# Patient Record
Sex: Female | Born: 1995 | Race: White | Hispanic: No | Marital: Married | Smoking: Never smoker
Health system: Southern US, Community
[De-identification: ages and names within clinical notes are randomized; demographics above are authoritative.]

## PROBLEM LIST (undated history)

## (undated) ENCOUNTER — Inpatient Hospital Stay: Payer: Self-pay

## (undated) ENCOUNTER — Ambulatory Visit: Admission: EM | Payer: Self-pay | Source: Home / Self Care

## (undated) ENCOUNTER — Ambulatory Visit: Admission: EM | Payer: Managed Care, Other (non HMO) | Source: Home / Self Care

## (undated) DIAGNOSIS — F4310 Post-traumatic stress disorder, unspecified: Secondary | ICD-10-CM

## (undated) DIAGNOSIS — R519 Headache, unspecified: Secondary | ICD-10-CM

## (undated) DIAGNOSIS — K219 Gastro-esophageal reflux disease without esophagitis: Secondary | ICD-10-CM

## (undated) DIAGNOSIS — M419 Scoliosis, unspecified: Secondary | ICD-10-CM

## (undated) DIAGNOSIS — F410 Panic disorder [episodic paroxysmal anxiety] without agoraphobia: Secondary | ICD-10-CM

## (undated) DIAGNOSIS — R51 Headache: Secondary | ICD-10-CM

## (undated) DIAGNOSIS — F419 Anxiety disorder, unspecified: Secondary | ICD-10-CM

## (undated) DIAGNOSIS — G43909 Migraine, unspecified, not intractable, without status migrainosus: Secondary | ICD-10-CM

## (undated) DIAGNOSIS — T4145XA Adverse effect of unspecified anesthetic, initial encounter: Secondary | ICD-10-CM

## (undated) DIAGNOSIS — D649 Anemia, unspecified: Secondary | ICD-10-CM

## (undated) DIAGNOSIS — T8859XA Other complications of anesthesia, initial encounter: Secondary | ICD-10-CM

## (undated) HISTORY — DX: Anxiety disorder, unspecified: F41.9

## (undated) HISTORY — PX: HEMORROIDECTOMY: SUR656

---

## 2017-11-03 NOTE — L&D Delivery Note (Signed)
[facility]  Labor Events:   Preterm labor:   Rupture date: 07/17/2018  Rupture time: 2:28 PM  Rupture type: Artificial Intact  Fluid Color:   Induction:   Augmentation:   Complications:   Cervical ripening:          Delivery:   Episiotomy:   Lacerations:   Repair suture:   Repair # of packets:   Blood loss (ml): 250   Information for the patient's newborn:  Sondra Comehornton, Boy Lori Willis [[id number]][[id number]]    Delivery 07/17/2018 3:35 PM by  Vaginal, Spontaneous Sex:  female Gestational Age: [id number] Delivery Clinician:   Living?:         APGARS  One minute Five minutes Ten minutes  Skin color:        Heart rate:        Grimace:        Muscle tone:        Breathing:        Totals: 8  9      Presentation/position:      Resuscitation:   Cord information:    Disposition of cord blood:     Blood gases sent?  Complications:   Placenta: Delivered:       appearance Newborn Measurements: Weight: 6 lb 11.2 oz (3040 g)  Height: 19.69"  Head circumference:    Chest circumference:    Other providers:    Additional  information: Forceps:   Vacuum:   Breech:   Observed anomalies        Delivery Note At 3:35 PM a viable and healthy female was delivered via Vaginal, Spontaneous (Presentation: Vertex; LOA position).  APGAR: 8, 9; weight 3040 grams.   Placenta status: spontaneously removed, intact.  Cord: 3-vesse,l with the following complications: tight nuchal, reducible after delivery of shoulders.  Cord pH: not obtained  Anesthesia:  Epidural Episiotomy: None Lacerations: None Suture Repair: None Est. Blood Loss (mL):  250  Mom to postpartum.  Baby to Couplet care / Skin to Skin.  Hildred Lasernika Emad Brechtel 07/17/2018, 3:49 PM

## 2017-11-20 ENCOUNTER — Encounter: Payer: Self-pay | Admitting: Certified Nurse Midwife

## 2017-11-20 ENCOUNTER — Ambulatory Visit: Payer: Medicaid Other | Admitting: Certified Nurse Midwife

## 2017-11-20 VITALS — BP 98/71 | HR 87 | Ht 63.0 in | Wt 148.3 lb

## 2017-11-20 DIAGNOSIS — N912 Amenorrhea, unspecified: Secondary | ICD-10-CM

## 2017-11-20 LAB — POCT URINE PREGNANCY: PREG TEST UR: POSITIVE — AB

## 2017-11-20 NOTE — Progress Notes (Signed)
Subjective:    Lenoria FarrierCheyenne Ceasar is a 22 y.o. female who presents for evaluation of amenorrhea. She believes she could be pregnant. Pregnancy is desired. Sexual Activity: single partner, contraception: none. Current symptoms also include: nausea. Last period was normal.   Patient's last menstrual period was 10/07/2017. The following portions of the patient's history were reviewed and updated as appropriate: allergies, current medications, past family history, past medical history, past social history, past surgical history and problem list.  Review of Systems Pertinent items are noted in HPI.     Objective:    BP 98/71   Pulse 87   Ht 5\' 3"  (1.6 m)   Wt 148 lb 4.8 oz (67.3 kg)   LMP 10/07/2017   BMI 26.27 kg/m  General: alert, cooperative, appears stated age and no acute distress    Lab Review Urine HCG: positive    Assessment:    Absence of menstruation.     Plan:    Pregnancy Test:Positive: EDC: 07/14/18. She has a history of twins in her family and has had twin pregnancy in the past that was a vanishing twin. . Briefly discussed pre-natal care options.  Encouraged well-balanced diet, plenty of rest when needed, pre-natal vitamins daily and walking for exercise. Discussed self-help for nausea, avoiding OTC medications until consulting provider or pharmacist, other than Tylenol as needed, minimal caffeine (1-2 cups daily) and avoiding alcohol. She will schedule u/s for hx twins in 2 wks. NOB nurse visit @ 10 wks and physical exam @ 12 wk. Doreene Burke.  Nahiem Dredge, CNM

## 2017-11-20 NOTE — Patient Instructions (Signed)
Eating Plan for Pregnant Women While you are pregnant, your body will require additional nutrition to help support your growing baby. It is recommended that you consume:  150 additional calories each day during your first trimester.  300 additional calories each day during your second trimester.  300 additional calories each day during your third trimester.  Eating a healthy, well-balanced diet is very important for your health and for your baby's health. You also have a higher need for some vitamins and minerals, such as folic acid, calcium, iron, and vitamin D. What do I need to know about eating during pregnancy?  Do not try to lose weight or go on a diet during pregnancy.  Choose healthy, nutritious foods. Choose  of a sandwich with a glass of milk instead of a candy bar or a high-calorie sugar-sweetened beverage.  Limit your overall intake of foods that have "empty calories." These are foods that have little nutritional value, such as sweets, desserts, candies, sugar-sweetened beverages, and fried foods.  Eat a variety of foods, especially fruits and vegetables.  Take a prenatal vitamin to help meet the additional needs during pregnancy, specifically for folic acid, iron, calcium, and vitamin D.  Remember to stay active. Ask your health care provider for exercise recommendations that are specific to you.  Practice good food safety and cleanliness, such as washing your hands before you eat and after you prepare raw meat. This helps to prevent foodborne illnesses, such as listeriosis, that can be very dangerous for your baby. Ask your health care provider for more information about listeriosis. What does 150 extra calories look like? Healthy options for an additional 150 calories each day could be any of the following:  Plain low-fat yogurt (6-8 oz) with  cup of berries.  1 apple with 2 teaspoons of peanut butter.  Cut-up vegetables with  cup of hummus.  Low-fat chocolate milk  (8 oz or 1 cup).  1 string cheese with 1 medium orange.   of a peanut butter and jelly sandwich on whole-wheat bread (1 tsp of peanut butter).  For 300 calories, you could eat two of those healthy options each day. What is a healthy amount of weight to gain? The recommended amount of weight for you to gain is based on your pre-pregnancy BMI. If your pre-pregnancy BMI was:  Less than 18 (underweight), you should gain 28-40 lb.  18-24.9 (normal), you should gain 25-35 lb.  25-29.9 (overweight), you should gain 15-25 lb.  Greater than 30 (obese), you should gain 11-20 lb.  What if I am having twins or multiples? Generally, pregnant women who will be having twins or multiples may need to increase their daily calories by 300-600 calories each day. The recommended range for total weight gain is 25-54 lb, depending on your pre-pregnancy BMI. Talk with your health care provider for specific guidance about additional nutritional needs, weight gain, and exercise during your pregnancy. What foods can I eat? Grains Any grains. Try to choose whole grains, such as whole-wheat bread, oatmeal, or brown rice. Vegetables Any vegetables. Try to eat a variety of colors and types of vegetables to get a full range of vitamins and minerals. Remember to wash your vegetables well before eating. Fruits Any fruits. Try to eat a variety of colors and types of fruit to get a full range of vitamins and minerals. Remember to wash your fruits well before eating. Meats and Other Protein Sources Lean meats, including chicken, turkey, fish, and lean cuts of beef, veal,   or pork. Make sure that all meats are cooked to "well done." Tofu. Tempeh. Beans. Eggs. Peanut butter and other nut butters. Seafood, such as shrimp, crab, and lobster. If you choose fish, select types that are higher in omega-3 fatty acids, including salmon, herring, mussels, trout, sardines, and pollock. Make sure that all meats are cooked to food-safe  temperatures. Dairy Pasteurized milk and milk alternatives. Pasteurized yogurt and pasteurized cheese. Cottage cheese. Sour cream. Beverages Water. Juices that contain 100% fruit juice or vegetable juice. Caffeine-free teas and decaffeinated coffee. Drinks that contain caffeine are okay to drink, but it is better to avoid caffeine. Keep your total caffeine intake to less than 200 mg each day (12 oz of coffee, tea, or soda) or as directed by your health care provider. Condiments Any pasteurized condiments. Sweets and Desserts Any sweets and desserts. Fats and Oils Any fats and oils. The items listed above may not be a complete list of recommended foods or beverages. Contact your dietitian for more options. What foods are not recommended? Vegetables Unpasteurized (raw) vegetable juices. Fruits Unpasteurized (raw) fruit juices. Meats and Other Protein Sources Cured meats that have nitrates, such as bacon, salami, and hotdogs. Luncheon meats, bologna, or other deli meats (unless they are reheated until they are steaming hot). Refrigerated pate, meat spreads from a meat counter, smoked seafood that is found in the refrigerated section of a store. Raw fish, such as sushi or sashimi. High mercury content fish, such as tilefish, shark, swordfish, and king mackerel. Raw meats, such as tuna or beef tartare. Undercooked meats and poultry. Make sure that all meats are cooked to food-safe temperatures. Dairy Unpasteurized (raw) milk and any foods that have raw milk in them. Soft cheeses, such as feta, queso blanco, queso fresco, Brie, Camembert cheeses, blue-veined cheeses, and Panela cheese (unless it is made with pasteurized milk, which must be stated on the label). Beverages Alcohol. Sugar-sweetened beverages, such as sodas, teas, or energy drinks. Condiments Homemade fermented foods and drinks, such as pickles, sauerkraut, or kombucha drinks. (Store-bought pasteurized versions of these are  okay.) Other Salads that are made in the store, such as ham salad, chicken salad, egg salad, tuna salad, and seafood salad. The items listed above may not be a complete list of foods and beverages to avoid. Contact your dietitian for more information. This information is not intended to replace advice given to you by your health care provider. Make sure you discuss any questions you have with your health care provider. Document Released: 08/04/2014 Document Revised: 03/27/2016 Document Reviewed: 04/04/2014 Elsevier Interactive Patient Education  2018 [employer] Prenatal Care WHAT IS PRENATAL CARE? Prenatal care is the process of caring for a pregnant woman before she gives birth. Prenatal care makes sure that she and her baby remain as healthy as possible throughout pregnancy. Prenatal care may be provided by a midwife, family practice health care provider, or a childbirth and pregnancy specialist (obstetrician). Prenatal care may include physical examinations, testing, treatments, and education on nutrition, lifestyle, and social support services. WHY IS PRENATAL CARE SO IMPORTANT? Early and consistent prenatal care increases the chance that you and your baby will remain healthy throughout your pregnancy. This type of care also decreases a baby's risk of being born too early (prematurely), or being born smaller than expected (small for gestational age). Any underlying medical conditions you may have that could pose a risk during your pregnancy are discussed during prenatal care visits. You will also be monitored regularly for any new   conditions that may arise during your pregnancy so they can be treated quickly and effectively. WHAT HAPPENS DURING PRENATAL CARE VISITS? Prenatal care visits may include the following: Discussion Tell your health care provider about any new signs or symptoms you have experienced since your last visit. These might include:  Nausea or vomiting.  Increased or  decreased level of energy.  Difficulty sleeping.  Back or leg pain.  Weight changes.  Frequent urination.  Shortness of breath with physical activity.  Changes in your skin, such as the development of a rash or itchiness.  Vaginal discharge or bleeding.  Feelings of excitement or nervousness.  Changes in your baby's movements.  You may want to write down any questions or topics you want to discuss with your health care provider and bring them with you to your appointment. Examination During your first prenatal care visit, you will likely have a complete physical exam. Your health care provider will often examine your vagina, cervix, and the position of your uterus, as well as check your heart, lungs, and other body systems. As your pregnancy progresses, your health care provider will measure the size of your uterus and your baby's position inside your uterus. He or she may also examine you for early signs of labor. Your prenatal visits may also include checking your blood pressure and, after about 10-12 weeks of pregnancy, listening to your baby's heartbeat. Testing Regular testing often includes:  Urinalysis. This checks your urine for glucose, protein, or signs of infection.  Blood count. This checks the levels of white and red blood cells in your body.  Tests for sexually transmitted infections (STIs). Testing for STIs at the beginning of pregnancy is routinely done and is required in many states.  Antibody testing. You will be checked to see if you are immune to certain illnesses, such as rubella, that can affect a developing fetus.  Glucose screen. Around 24-28 weeks of pregnancy, your blood glucose level will be checked for signs of gestational diabetes. Follow-up tests may be recommended.  Group B strep. This is a bacteria that is commonly found inside a woman's vagina. This test will inform your health care provider if you need an antibiotic to reduce the amount of this  bacteria in your body prior to labor and childbirth.  Ultrasound. Many pregnant women undergo an ultrasound screening around 18-20 weeks of pregnancy to evaluate the health of the fetus and check for any developmental abnormalities.  HIV (human immunodeficiency virus) testing. Early in your pregnancy, you will be screened for HIV. If you are at high risk for HIV, this test may be repeated during your third trimester of pregnancy.  You may be offered other testing based on your age, personal or family medical history, or other factors. HOW OFTEN SHOULD I PLAN TO SEE MY HEALTH CARE PROVIDER FOR PRENATAL CARE? Your prenatal care check-up schedule depends on any medical conditions you have before, or develop during, your pregnancy. If you do not have any underlying medical conditions, you will likely be seen for checkups:  Monthly, during the first 6 months of pregnancy.  Twice a month during months 7 and 8 of pregnancy.  Weekly starting in the 9th month of pregnancy and until delivery.  If you develop signs of early labor or other concerning signs or symptoms, you may need to see your health care provider more often. Ask your health care provider what prenatal care schedule is best for you. WHAT CAN I DO TO KEEP MYSELF AND   MY BABY AS HEALTHY AS POSSIBLE DURING MY PREGNANCY?  Take a prenatal vitamin containing 400 micrograms (0.4 mg) of folic acid every day. Your health care provider may also ask you to take additional vitamins such as iodine, vitamin D, iron, copper, and zinc.  Take 1500-2000 mg of calcium daily starting at your 20th week of pregnancy until you deliver your baby.  Make sure you are up to date on your vaccinations. Unless directed otherwise by your health care provider: ? You should receive a tetanus, diphtheria, and pertussis (Tdap) vaccination between the 27th and 36th week of your pregnancy, regardless of when your last Tdap immunization occurred. This helps protect your baby  from whooping cough (pertussis) after he or she is born. ? You should receive an annual inactivated influenza vaccine (IIV) to help protect you and your baby from influenza. This can be done at any point during your pregnancy.  Eat a well-rounded diet that includes: ? Fresh fruits and vegetables. ? Lean proteins. ? Calcium-rich foods such as milk, yogurt, hard cheeses, and dark, leafy greens. ? Whole grain breads.  Do noteat seafood high in mercury, including: ? Swordfish. ? Tilefish. ? Shark. ? King mackerel. ? More than 6 oz tuna per week.  Do not eat: ? Raw or undercooked meats or eggs. ? Unpasteurized foods, such as soft cheeses (brie, blue, or feta), juices, and milks. ? Lunch meats. ? Hot dogs that have not been heated until they are steaming.  Drink enough water to keep your urine clear or pale yellow. For many women, this may be 10 or more 8 oz glasses of water each day. Keeping yourself hydrated helps deliver nutrients to your baby and may prevent the start of pre-term uterine contractions.  Do not use any tobacco products including cigarettes, chewing tobacco, or electronic cigarettes. If you need help quitting, ask your health care provider.  Do not drink beverages containing alcohol. No safe level of alcohol consumption during pregnancy has been determined.  Do not use any illegal drugs. These can harm your developing baby or cause a miscarriage.  Ask your health care provider or pharmacist before taking any prescription or over-the-counter medicines, herbs, or supplements.  Limit your caffeine intake to no more than 200 mg per day.  Exercise. Unless told otherwise by your health care provider, try to get 30 minutes of moderate exercise most days of the week. Do not  do high-impact activities, contact sports, or activities with a high risk of falling, such as horseback riding or downhill skiing.  Get plenty of rest.  Avoid anything that raises your body temperature,  such as hot tubs and saunas.  If you own a cat, do not empty its litter box. Bacteria contained in cat feces can cause an infection called toxoplasmosis. This can result in serious harm to the fetus.  Stay away from chemicals such as insecticides, lead, mercury, and cleaning or paint products that contain solvents.  Do not have any X-rays taken unless medically necessary.  Take a childbirth and breastfeeding preparation class. Ask your health care provider if you need a referral or recommendation.  This information is not intended to replace advice given to you by your health care provider. Make sure you discuss any questions you have with your health care provider. Document Released: 10/23/2003 Document Revised: 03/24/2016 Document Reviewed: 01/04/2014 Elsevier Interactive Patient Education  2017 Elsevier Inc.  

## 2017-11-23 ENCOUNTER — Other Ambulatory Visit: Payer: Self-pay | Admitting: Certified Nurse Midwife

## 2017-11-23 DIAGNOSIS — Z8759 Personal history of other complications of pregnancy, childbirth and the puerperium: Secondary | ICD-10-CM

## 2017-11-23 DIAGNOSIS — N926 Irregular menstruation, unspecified: Secondary | ICD-10-CM

## 2017-11-26 ENCOUNTER — Telehealth: Payer: Self-pay | Admitting: Certified Nurse Midwife

## 2017-11-26 ENCOUNTER — Telehealth: Payer: Self-pay

## 2017-11-26 NOTE — Telephone Encounter (Signed)
The patient called and stated that her prenatal vitamins are making her nauseous, and would like to have a nurse call her and send something in to help with her nausea. No other information was disclosed. Please advise.

## 2017-11-26 NOTE — Telephone Encounter (Signed)
Spoke with pt- N/V protocal reviewed with pt- she states none of the agents suggested works for her.Advised frequent small meals, taking PNV at night, etc. States she had this issue with her other pregnancies. Advised to let us know if she starts vomiting.

## 2017-12-02 ENCOUNTER — Ambulatory Visit (INDEPENDENT_AMBULATORY_CARE_PROVIDER_SITE_OTHER): Payer: Medicaid Other

## 2017-12-02 DIAGNOSIS — Z8759 Personal history of other complications of pregnancy, childbirth and the puerperium: Secondary | ICD-10-CM | POA: Diagnosis not present

## 2017-12-02 DIAGNOSIS — N926 Irregular menstruation, unspecified: Secondary | ICD-10-CM | POA: Diagnosis not present

## 2017-12-03 ENCOUNTER — Other Ambulatory Visit: Payer: Self-pay

## 2017-12-03 ENCOUNTER — Ambulatory Visit
Admission: EM | Admit: 2017-12-03 | Discharge: 2017-12-03 | Disposition: A | Discharge status: Home / Self Care | Payer: Medicaid Other | Attending: Family Medicine | Admitting: Family Medicine

## 2017-12-03 ENCOUNTER — Telehealth: Payer: Self-pay | Admitting: Emergency Medicine

## 2017-12-03 DIAGNOSIS — O210 Mild hyperemesis gravidarum: Secondary | ICD-10-CM | POA: Diagnosis not present

## 2017-12-03 DIAGNOSIS — Z79899 Other long term (current) drug therapy: Secondary | ICD-10-CM | POA: Insufficient documentation

## 2017-12-03 DIAGNOSIS — Z888 Allergy status to other drugs, medicaments and biological substances status: Secondary | ICD-10-CM | POA: Diagnosis not present

## 2017-12-03 DIAGNOSIS — F419 Anxiety disorder, unspecified: Secondary | ICD-10-CM | POA: Diagnosis not present

## 2017-12-03 DIAGNOSIS — Z3A00 Weeks of gestation of pregnancy not specified: Secondary | ICD-10-CM | POA: Diagnosis not present

## 2017-12-03 DIAGNOSIS — O26899 Other specified pregnancy related conditions, unspecified trimester: Secondary | ICD-10-CM | POA: Diagnosis not present

## 2017-12-03 DIAGNOSIS — J039 Acute tonsillitis, unspecified: Secondary | ICD-10-CM | POA: Diagnosis not present

## 2017-12-03 DIAGNOSIS — O219 Vomiting of pregnancy, unspecified: Secondary | ICD-10-CM

## 2017-12-03 DIAGNOSIS — O99340 Other mental disorders complicating pregnancy, unspecified trimester: Secondary | ICD-10-CM | POA: Insufficient documentation

## 2017-12-03 DIAGNOSIS — J029 Acute pharyngitis, unspecified: Secondary | ICD-10-CM | POA: Diagnosis present

## 2017-12-03 LAB — RAPID INFLUENZA A&B ANTIGENS (ARMC ONLY)
INFLUENZA A (ARMC): NEGATIVE
INFLUENZA B (ARMC): NEGATIVE

## 2017-12-03 LAB — RAPID STREP SCREEN (MED CTR MEBANE ONLY): STREPTOCOCCUS, GROUP A SCREEN (DIRECT): NEGATIVE

## 2017-12-03 MED ORDER — AMOXICILLIN 500 MG PO TABS: 500.0000 mg | ORAL_TABLET | Freq: Two times a day (BID) | ORAL | 0 refills | Status: DC

## 2017-12-03 MED ORDER — DOXYLAMINE-PYRIDOXINE 10-10 MG PO TBEC: DELAYED_RELEASE_TABLET | ORAL | 0 refills | Status: DC

## 2017-12-03 NOTE — Discharge Instructions (Signed)
Medications as prescribed. ° °Take care ° °Dr. Yamaira Spinner  °

## 2017-12-03 NOTE — ED Triage Notes (Signed)
Pt with 3 days of cough, headache, and sore throat. Noticed a white spot on her right throat. Hurts to swallow. Pt is [redacted] weeks pregnant. Pain

## 2017-12-03 NOTE — ED Provider Notes (Signed)
[facility]    CSN: [id number] Arrival date & time: 12/03/17  1049  History   Chief Complaint Chief Complaint  Patient presents with  . Sore Throat   HPI  22 year old female presents with sore throat.  Patient also reporting significant nausea vomiting due to pregnancy.  Patient reports a 5-day history of sore throat.  Difficulty swallowing.  Pain is currently 4/10 in severity.  She said no improvement with over-the-counter treatment.  She is currently pregnant.  No fevers or chills.  No other associated symptoms.  Additionally, patient states that she has had ongoing nausea and has had some vomiting.  She states that this is quite common for her pregnancies.  She has had hyperemesis in the past.  She would like to discuss treatment options for morning sickness/nausea/vomiting in pregnancy.  Past Medical History:  Diagnosis Date  . Anxiety   Hyperemesis  Past Surgical History:  Procedure Laterality Date  . HEMORROIDECTOMY     OB History    Gravida Para Term Preterm AB Living   3 2           SAB TAB Ectopic Multiple Live Births                 Home Medications    Prior to Admission medications   Medication Sig Start Date End Date Taking? Authorizing Provider  Prenatal Vit-Fe Fumarate-FA (PRENATAL MULTIVITAMIN) TABS tablet Take 1 tablet by mouth daily at 12 noon.   Yes [provider]  amoxicillin (AMOXIL) 500 MG tablet Take 1 tablet (500 mg total) by mouth 2 (two) times daily. 12/03/17   Tommie Sams, DO  Doxylamine-Pyridoxine 10-10 MG TBEC Two tablets at bedtime on day 1 and 2; if symptoms persist, take 1 tablet in morning and 2 tablets at bedtime on day 3; if symptoms persist, may increase to 1 tablet in morning, 1 tablet mid-afternoon, and 2 tablets at bedtime on day 4 12/03/17   Tommie Sams, DO    Family History Family History  Problem Relation Age of Onset  . Hypertension Mother   . Anemia Mother   . Diabetes Mother   . Hypertension Father      Social History Social History   Tobacco Use  . Smoking status: Never Smoker  . Smokeless tobacco: Never Used  Substance Use Topics  . Alcohol use: No    Frequency: Never  . Drug use: No     Allergies   Benadryl [diphenhydramine]   Review of Systems Review of Systems  Constitutional: Negative.   HENT: Positive for sore throat.   Gastrointestinal: Positive for nausea and vomiting.   Physical Exam Triage Vital Signs ED Triage Vitals  Enc Vitals Group     BP 12/03/17 1109 113/79     Pulse Rate 12/03/17 1109 90     Resp 12/03/17 1109 18     Temp 12/03/17 1109 98 F (36.7 C)     Temp src --      SpO2 12/03/17 1109 100 %     Weight 12/03/17 1105 148 lb (67.1 kg)     Height 12/03/17 1105 5\' 3"  (1.6 m)     Head Circumference --      Peak Flow --      Pain Score 12/03/17 1105 4     Pain Loc --      Pain Edu? --      Excl. in GC? --    Updated Vital Signs BP 113/79 (BP Location:  Left Arm)   Pulse 90   Temp 98 F (36.7 C)   Resp 18   Ht 5\' 3"  (1.6 m)   Wt 148 lb (67.1 kg)   LMP 10/07/2017   SpO2 100%   BMI 26.22 kg/m   Physical Exam  Constitutional: She is oriented to person, place, and time. She appears well-developed. No distress.  HENT:  Head: Normocephalic and atraumatic.  Right-sided tonsillar edema and erythema.  No exudate.  Neck: Neck supple.  Tender anterior cervical region. No appreciable adenopathy.  Cardiovascular: Normal rate and regular rhythm.  No murmur heard. Pulmonary/Chest: Effort normal and breath sounds normal. She has no wheezes. She has no rales.  Neurological: She is alert and oriented to person, place, and time.  Psychiatric: She has a normal mood and affect. Her behavior is normal.  Nursing note and vitals reviewed.  UC Treatments / Results  Labs (all labs ordered are listed, but only abnormal results are displayed) Labs Reviewed  RAPID STREP SCREEN (NOT [facility])  RAPID INFLUENZA A&B ANTIGENS ([facility] ONLY)  CULTURE, GROUP A  STREP [facility])    EKG  EKG Interpretation None       Radiology No results found.  Procedures Procedures (including critical care time)  Medications Ordered in UC Medications - No data to display   Initial Impression / Assessment and Plan / UC Course  I have reviewed the triage vital signs and the nursing notes.  Pertinent labs & imaging results that were available during my care of the patient were reviewed by me and considered in my medical decision making (see chart for details).    22 year old female presents with sore throat. Rapid strep negative.  Given persistence of symptoms and exam findings, I discussed treatment versus watchful waiting.  Patient elected for treatment.  Placing on amoxicillin.  Regarding her nausea vomiting in pregnancy, I recommended a trial of Diclegis.  Epic did give me a flag for interaction with Benadryl which she is allergic to.  I informed her of this and gave her a prescription to be used if she so desires to try.  Final Clinical Impressions(s) / UC Diagnoses   Final diagnoses:  Tonsillitis  Nausea and vomiting in pregnancy    ED Discharge Orders        Ordered    amoxicillin (AMOXIL) 500 MG tablet  2 times daily     12/03/17 1249    Doxylamine-Pyridoxine 10-10 MG TBEC     12/03/17 1249     Controlled Substance Prescriptions [place] Controlled Substance Registry consulted? Not Applicable   Tommie SamsCook, Ephram Kornegay G, DO 12/03/17 1258

## 2017-12-03 NOTE — Telephone Encounter (Signed)
Patient call stating the prescription that was sent in for nausea requires prior auth. After speaking with Dr. Adriana Simasook, advised patient that she can take OTC Unisom and Vitamin B6. Advised patient to get pharmacist to help her with the dose. Patient agreed and voiced understanding. [facility] and advised of above.

## 2017-12-06 LAB — CULTURE, GROUP A STREP (THRC)

## 2017-12-07 ENCOUNTER — Emergency Department
Admission: EM | Admit: 2017-12-07 | Discharge: 2017-12-07 | Disposition: A | Discharge status: Home / Self Care | Payer: Medicaid Other | Attending: Student in an Organized Health Care Education/Training Program | Admitting: Student in an Organized Health Care Education/Training Program

## 2017-12-07 ENCOUNTER — Encounter: Payer: Self-pay | Admitting: Emergency Medicine

## 2017-12-07 ENCOUNTER — Other Ambulatory Visit: Payer: Self-pay

## 2017-12-07 DIAGNOSIS — Z79899 Other long term (current) drug therapy: Secondary | ICD-10-CM | POA: Insufficient documentation

## 2017-12-07 DIAGNOSIS — I10 Essential (primary) hypertension: Secondary | ICD-10-CM | POA: Insufficient documentation

## 2017-12-07 DIAGNOSIS — E119 Type 2 diabetes mellitus without complications: Secondary | ICD-10-CM | POA: Insufficient documentation

## 2017-12-07 DIAGNOSIS — O210 Mild hyperemesis gravidarum: Secondary | ICD-10-CM | POA: Insufficient documentation

## 2017-12-07 DIAGNOSIS — Z3A09 9 weeks gestation of pregnancy: Secondary | ICD-10-CM | POA: Insufficient documentation

## 2017-12-07 LAB — COMPREHENSIVE METABOLIC PANEL
ALBUMIN: 4.4 g/dL (ref 3.5–5.0)
ALK PHOS: 59 U/L (ref 38–126)
ALT: 14 U/L (ref 14–54)
ANION GAP: 12 (ref 5–15)
AST: 24 U/L (ref 15–41)
BUN: 12 mg/dL (ref 6–20)
CALCIUM: 10.3 mg/dL (ref 8.9–10.3)
CO2: 23 mmol/L (ref 22–32)
Chloride: 105 mmol/L (ref 101–111)
Creatinine, Ser: 0.63 mg/dL (ref 0.44–1.00)
GFR calc Af Amer: >60 mL/min (ref >60)
GLUCOSE: 99 mg/dL (ref 65–99)
Potassium: 4 mmol/L (ref 3.5–5.1)
Sodium: 140 mmol/L (ref 135–145)
TOTAL PROTEIN: 8.3 g/dL — AB (ref 6.5–8.1)
Total Bilirubin: 0.5 mg/dL (ref 0.3–1.2)

## 2017-12-07 LAB — CBC WITH DIFFERENTIAL/PLATELET
BASOS PCT: 0 %
Basophils Absolute: 0 10*3/uL (ref 0–0.1)
Eosinophils Absolute: 0 10*3/uL (ref 0–0.7)
Eosinophils Relative: 0 %
HEMATOCRIT: 42.2 % (ref 35.0–47.0)
HEMOGLOBIN: 13.9 g/dL (ref 12.0–16.0)
LYMPHS ABS: 1.8 10*3/uL (ref 1.0–3.6)
Lymphocytes Relative: 17 %
MCH: 28.6 pg (ref 26.0–34.0)
MCHC: 33 g/dL (ref 32.0–36.0)
MCV: 86.9 fL (ref 80.0–100.0)
MONO ABS: 0.7 10*3/uL (ref 0.2–0.9)
MONOS PCT: 7 %
NEUTROS ABS: 7.8 10*3/uL — AB (ref 1.4–6.5)
NEUTROS PCT: 76 %
Platelets: 244 10*3/uL (ref 150–440)
RBC: 4.86 MIL/uL (ref 3.80–5.20)
RDW: 14.9 % — AB (ref 11.5–14.5)
WBC: 10.3 10*3/uL (ref 3.6–11.0)

## 2017-12-07 LAB — URINALYSIS, COMPLETE (UACMP) WITH MICROSCOPIC
BACTERIA UA: NONE SEEN
Bilirubin Urine: NEGATIVE
Glucose, UA: >=500 mg/dL — AB
Hgb urine dipstick: NEGATIVE
Ketones, ur: 80 mg/dL — AB
Leukocytes, UA: NEGATIVE
Nitrite: NEGATIVE
PROTEIN: 30 mg/dL — AB
SPECIFIC GRAVITY, URINE: 1.035 — AB (ref 1.005–1.030)
pH: 5 (ref 5.0–8.0)

## 2017-12-07 LAB — HCG, QUANTITATIVE, PREGNANCY: hCG, Beta Chain, Quant, S: 99409 m[IU]/mL — ABNORMAL HIGH (ref <5)

## 2017-12-07 LAB — LIPASE, BLOOD: LIPASE: 37 U/L (ref 11–51)

## 2017-12-07 MED ORDER — METOCLOPRAMIDE HCL 10 MG PO TABS: 10.0000 mg | ORAL_TABLET | Freq: Four times a day (QID) | ORAL | 0 refills | Status: DC | PRN

## 2017-12-07 MED ORDER — DEXTROSE 5 % AND 0.45 % NACL IV BOLUS
1000.0000 mL | Freq: Once | INTRAVENOUS | Status: AC
  Administered 2017-12-07: 1000 mL via INTRAVENOUS
  Filled 2017-12-07: qty 1000

## 2017-12-07 MED ORDER — SODIUM CHLORIDE 0.9 % IV BOLUS (SEPSIS)
1000.0000 mL | Freq: Once | INTRAVENOUS | Status: AC
Start: 2017-12-07 — End: 2017-12-07
  Administered 2017-12-07: 1000 mL via INTRAVENOUS

## 2017-12-07 MED ORDER — METOCLOPRAMIDE HCL 5 MG/ML IJ SOLN
10.0000 mg | Freq: Once | INTRAMUSCULAR | Status: AC
  Administered 2017-12-07: 10 mg via INTRAVENOUS
  Filled 2017-12-07: qty 2

## 2017-12-07 NOTE — ED Notes (Signed)
Pt states she is about [redacted] weeks pregnant and has been having issues with hyperemesis , states she was seen at the urgent care the other day and Rx diclegis but her insurance denied and got the unisom and B vitamin instead with no relief..Marland Kitchen

## 2017-12-07 NOTE — ED Notes (Signed)
Pt ambulatory to wheel chair upon discharge. This RN brought pt out front to car. Verbalized understanding of discharge instructions, follow-up care and prescription. VSS. Skin warm and dry. A&O x4.

## 2017-12-07 NOTE — ED Provider Notes (Signed)
[facility] Emergency Department Provider Note    First MD Initiated Contact with Patient 12/07/17 [phone]     (approximate)  I have reviewed the triage vital signs and the nursing notes.   HISTORY  Chief Complaint Emesis During Pregnancy    HPI Lori Willis is a 22 y.o. female G3P2 history of hyperemesis gravidarum presents roughly [redacted] weeks pregnant with nausea vomiting having difficulty keeping anything down.  States she has no PE today.  States that every time she tries to be given sips of ginger ale she vomits.  Denies any fevers.  Was recently seen in urgent care and prescription for likely just which she could not afford as her insurance company.  Denies any pain.  No chest pain or shortness of breath.  No dysuria.  No vaginal discharge or bleeding.  No pelvic pain or cramping.    Past Medical History:  Diagnosis Date  . Anxiety    Family History  Problem Relation Age of Onset  . Hypertension Mother   . Anemia Mother   . Diabetes Mother   . Hypertension Father    Past Surgical History:  Procedure Laterality Date  . HEMORROIDECTOMY     There are no active problems to display for this patient.     Prior to Admission medications   Medication Sig Start Date End Date Taking? Authorizing Provider  amoxicillin (AMOXIL) 500 MG tablet Take 1 tablet (500 mg total) by mouth 2 (two) times daily. 12/03/17  Yes Tommie Sams, DO  Prenatal Vit-Fe Fumarate-FA (PRENATAL MULTIVITAMIN) TABS tablet Take 1 tablet by mouth daily at 12 noon.   Yes [provider]  Doxylamine-Pyridoxine 10-10 MG TBEC Two tablets at bedtime on day 1 and 2; if symptoms persist, take 1 tablet in morning and 2 tablets at bedtime on day 3; if symptoms persist, may increase to 1 tablet in morning, 1 tablet mid-afternoon, and 2 tablets at bedtime on day 4 Patient not taking: Reported on 12/07/2017 12/03/17   Tommie Sams, DO  metoCLOPramide (REGLAN) 10 MG tablet Take 1 tablet  (10 mg total) by mouth every 6 (six) hours as needed for nausea. 12/07/17 12/07/18  Willy Eddy, MD    Allergies Benadryl [diphenhydramine]    Social History Social History   Tobacco Use  . Smoking status: Never Smoker  . Smokeless tobacco: Never Used  Substance Use Topics  . Alcohol use: No    Frequency: Never  . Drug use: No    Review of Systems Patient denies headaches, rhinorrhea, blurry vision, numbness, shortness of breath, chest pain, edema, cough, abdominal pain, nausea, vomiting, diarrhea, dysuria, fevers, rashes or hallucinations unless otherwise stated above in HPI. ____________________________________________   PHYSICAL EXAM:  VITAL SIGNS: Vitals:   12/07/17 0819 12/07/17 1122  BP: 123/87 107/73  Pulse: 96 92  Resp: 18 16  Temp: 98.2 F (36.8 C)   SpO2: 98% 100%    Constitutional: Alert and oriented. Well appearing and in no acute distress. Eyes: Conjunctivae are normal.  Head: Atraumatic. Nose: No congestion/rhinnorhea. Mouth/Throat: Mucous membranes are moist.   Neck: No stridor. Painless ROM.  Cardiovascular: Normal rate, regular rhythm. Grossly normal heart sounds.  Good peripheral circulation. Respiratory: Normal respiratory effort.  No retractions. Lungs CTAB. Gastrointestinal: Soft and nontender. No distention. No abdominal bruits. No CVA tenderness. Genitourinary:  Musculoskeletal: No lower extremity tenderness nor edema.  No joint effusions. Neurologic:  Normal speech and language. No gross focal neurologic deficits are appreciated. No facial  droop Skin:  Skin is warm, dry and intact. No rash noted. Psychiatric: Mood and affect are normal. Speech and behavior are normal.  ____________________________________________   LABS (all labs ordered are listed, but only abnormal results are displayed)  Results for orders placed or performed during the hospital encounter of 12/07/17 (from the past 24 hour(s))  CBC with Differential     Status:  Abnormal   Collection Time: 12/07/17  8:23 AM  Result Value Ref Range   WBC 10.3 3.6 - 11.0 K/uL   RBC 4.86 3.80 - 5.20 MIL/uL   Hemoglobin 13.9 12.0 - 16.0 g/dL   HCT 40.9 81.1 - 91.4 %   MCV 86.9 80.0 - 100.0 fL   MCH 28.6 26.0 - 34.0 pg   MCHC 33.0 32.0 - 36.0 g/dL   RDW 78.2 (H) 95.6 - 21.3 %   Platelets 244 150 - 440 K/uL   Neutrophils Relative % 76 %   Neutro Abs 7.8 (H) 1.4 - 6.5 K/uL   Lymphocytes Relative 17 %   Lymphs Abs 1.8 1.0 - 3.6 K/uL   Monocytes Relative 7 %   Monocytes Absolute 0.7 0.2 - 0.9 K/uL   Eosinophils Relative 0 %   Eosinophils Absolute 0.0 0 - 0.7 K/uL   Basophils Relative 0 %   Basophils Absolute 0.0 0 - 0.1 K/uL  Comprehensive metabolic panel     Status: Abnormal   Collection Time: 12/07/17  8:23 AM  Result Value Ref Range   Sodium 140 135 - 145 mmol/L   Potassium 4.0 3.5 - 5.1 mmol/L   Chloride 105 101 - 111 mmol/L   CO2 23 22 - 32 mmol/L   Glucose, Bld 99 65 - 99 mg/dL   BUN 12 6 - 20 mg/dL   Creatinine, Ser 0.86 0.44 - 1.00 mg/dL   Calcium 57.8 8.9 - 46.9 mg/dL   Total Protein 8.3 (H) 6.5 - 8.1 g/dL   Albumin 4.4 3.5 - 5.0 g/dL   AST 24 15 - 41 U/L   ALT 14 14 - 54 U/L   Alkaline Phosphatase 59 38 - 126 U/L   Total Bilirubin 0.5 0.3 - 1.2 mg/dL   GFR calc non Af Amer >60 >60 mL/min   GFR calc Af Amer >60 >60 mL/min   Anion gap 12 5 - 15  Lipase, blood     Status: None   Collection Time: 12/07/17  8:23 AM  Result Value Ref Range   Lipase 37 11 - 51 U/L  hCG, quantitative, pregnancy     Status: Abnormal   Collection Time: 12/07/17  8:23 AM  Result Value Ref Range   hCG, Beta Chain, Quant, S 99,409 (H) <5 mIU/mL  Urinalysis, Complete w Microscopic     Status: Abnormal   Collection Time: 12/07/17 11:15 AM  Result Value Ref Range   Color, Urine YELLOW (A) YELLOW   APPearance HAZY (A) CLEAR   Specific Gravity, Urine 1.035 (H) 1.005 - 1.030   pH 5.0 5.0 - 8.0   Glucose, UA >=500 (A) NEGATIVE mg/dL   Hgb urine dipstick NEGATIVE  NEGATIVE   Bilirubin Urine NEGATIVE NEGATIVE   Ketones, ur 80 (A) NEGATIVE mg/dL   Protein, ur 30 (A) NEGATIVE mg/dL   Nitrite NEGATIVE NEGATIVE   Leukocytes, UA NEGATIVE NEGATIVE   RBC / HPF 0-5 0 - 5 RBC/hpf   WBC, UA 0-5 0 - 5 WBC/hpf   Bacteria, UA NONE SEEN NONE SEEN   Squamous Epithelial / LPF 0-5 (A)  NONE SEEN   Mucus PRESENT    ____________________________________________ ____________________________________________  RADIOLOGY   ____________________________________________   PROCEDURES  Procedure(s) performed:  Procedures    Critical Care performed: no ____________________________________________   INITIAL IMPRESSION / ASSESSMENT AND PLAN / ED COURSE  Pertinent labs & imaging results that were available during my care of the patient were reviewed by me and considered in my medical decision making (see chart for details).  DDX: Hyperemesis, enteritis, dehydration, AK I, UTI, hyperglycemia, gastritis, appendicitis  Lori Willis is a 22 y.o. who presents to the ED with hyperemesis.  Her abdominal exam is soft and benign.  She is afebrile.  Will give IV fluids for dehydration.  Will check urine.  Blood work is otherwise reassuring.  No signs or symptoms to suggest ectopic.   Clinical Course as of Dec 07 1136  [facility]  1138 Patient reassessed.  Feeling much better.  Still has some nausea but is tolerating food and oral hydration.  Does have some ketones on her urine which would be consistent with hyperemesis.  States that she had significant improvement with Reglan.  We will give her a prescription for Reglan discussed strict return precautions.  There is no evidence of any UTI or infectious process. Have discussed with the patient and available family all diagnostics and treatments performed thus far and all questions were answered to the best of my ability. The patient demonstrates understanding and agreement with plan.   [PR]    Clinical Course User  Index [PR] Willy Eddyobinson, Kinlie Janice, MD     ____________________________________________   FINAL CLINICAL IMPRESSION(S) / ED DIAGNOSES  Final diagnoses:  Hyperemesis affecting pregnancy, antepartum      NEW MEDICATIONS STARTED DURING THIS VISIT:  New Prescriptions   METOCLOPRAMIDE (REGLAN) 10 MG TABLET    Take 1 tablet (10 mg total) by mouth every 6 (six) hours as needed for nausea.     Note:  This document was prepared using Dragon voice recognition software and may include unintentional dictation errors.    Willy Eddyobinson, Liliya Fullenwider, MD 12/07/17 [phone]

## 2017-12-07 NOTE — ED Triage Notes (Addendum)
States vomiting x 2 days, [redacted] weeks pregnant. Epigastric pain began today. No lower abd pain or vag bleeding. Patient states was brought EMS and was given zofran en route.

## 2017-12-07 NOTE — ED Notes (Signed)
Pt unable to urinate at this time, given specimen cup for when is able to void. Pt sent back out to lobby. 

## 2017-12-07 NOTE — ED Notes (Signed)
FIRST NURSE NOTE:  [facility], dx with tonsillits, N/v x 3 days, pt currently [redacted] weeks  Pregnant.  20 G placed in R AC, given 4mg  Zofran by EMS IV   133/78 p98 97% CBG 116   G-4 P-3 A-1.

## 2017-12-11 ENCOUNTER — Telehealth: Payer: Self-pay | Admitting: Certified Nurse Midwife

## 2017-12-11 ENCOUNTER — Other Ambulatory Visit: Payer: Self-pay

## 2017-12-11 MED ORDER — METOCLOPRAMIDE HCL 10 MG PO TABS: 10.0000 mg | ORAL_TABLET | Freq: Four times a day (QID) | ORAL | 0 refills | Status: DC | PRN

## 2017-12-11 NOTE — Telephone Encounter (Signed)
Patient called requesting a refill on Reglan that was given to her in the ER yesterday. She states that it is the only thing that has helped with the nausea. Thanks

## 2017-12-11 NOTE — Telephone Encounter (Signed)
Patient called requesting a refill on Reglan which was given to her in the

## 2017-12-14 ENCOUNTER — Telehealth: Payer: Self-pay

## 2017-12-14 ENCOUNTER — Telehealth: Payer: Self-pay | Admitting: Certified Nurse Midwife

## 2017-12-14 NOTE — Telephone Encounter (Signed)
Faxed received from telephone advise line- pt called on 12/12/17 for a refill on Reglan. Script escribed by Dr Valentino Saxonherry #[id number] 1 tab every 6 hours x 1 refill. Pt called today for a refill. When I spoke with her she was unaware of script already sent. Spoke with [facility] pharmacist -Delice Bisonara- who states script is on file. Ok given to fill. Informed pt script will be waiting.

## 2017-12-14 NOTE — Telephone Encounter (Signed)
The patient called and stated that she needs a refill of her nausea medication. No other information was disclosed. Please advise.

## 2017-12-18 ENCOUNTER — Emergency Department
Admission: EM | Admit: 2017-12-18 | Discharge: 2017-12-18 | Disposition: A | Discharge status: Home / Self Care | Payer: Self-pay | Attending: Student in an Organized Health Care Education/Training Program | Admitting: Student in an Organized Health Care Education/Training Program

## 2017-12-18 ENCOUNTER — Other Ambulatory Visit: Payer: Self-pay

## 2017-12-18 ENCOUNTER — Encounter: Payer: Self-pay | Admitting: Emergency Medicine

## 2017-12-18 DIAGNOSIS — O211 Hyperemesis gravidarum with metabolic disturbance: Secondary | ICD-10-CM | POA: Insufficient documentation

## 2017-12-18 DIAGNOSIS — O1211 Gestational proteinuria, first trimester: Secondary | ICD-10-CM | POA: Insufficient documentation

## 2017-12-18 DIAGNOSIS — Z3A10 10 weeks gestation of pregnancy: Secondary | ICD-10-CM | POA: Insufficient documentation

## 2017-12-18 DIAGNOSIS — Z79899 Other long term (current) drug therapy: Secondary | ICD-10-CM | POA: Insufficient documentation

## 2017-12-18 LAB — BASIC METABOLIC PANEL
ANION GAP: 11 (ref 5–15)
BUN: 9 mg/dL (ref 6–20)
CHLORIDE: 103 mmol/L (ref 101–111)
CO2: 22 mmol/L (ref 22–32)
Calcium: 9.7 mg/dL (ref 8.9–10.3)
Creatinine, Ser: 0.46 mg/dL (ref 0.44–1.00)
Glucose, Bld: 82 mg/dL (ref 65–99)
POTASSIUM: 3.3 mmol/L — AB (ref 3.5–5.1)
SODIUM: 136 mmol/L (ref 135–145)

## 2017-12-18 LAB — URINALYSIS, COMPLETE (UACMP) WITH MICROSCOPIC
BILIRUBIN URINE: NEGATIVE
Bacteria, UA: NONE SEEN
GLUCOSE, UA: NEGATIVE mg/dL
HGB URINE DIPSTICK: NEGATIVE
KETONES UR: 80 mg/dL — AB
LEUKOCYTES UA: NEGATIVE
NITRITE: NEGATIVE
PH: 5 (ref 5.0–8.0)
Protein, ur: 100 mg/dL — AB
Specific Gravity, Urine: 1.03 (ref 1.005–1.030)

## 2017-12-18 LAB — CBC WITH DIFFERENTIAL/PLATELET
BASOS PCT: 0 %
Basophils Absolute: 0 10*3/uL (ref 0–0.1)
EOS ABS: 0 10*3/uL (ref 0–0.7)
Eosinophils Relative: 0 %
HEMATOCRIT: 42.6 % (ref 35.0–47.0)
Hemoglobin: 14.1 g/dL (ref 12.0–16.0)
Lymphocytes Relative: 20 %
Lymphs Abs: 2 10*3/uL (ref 1.0–3.6)
MCH: 28.5 pg (ref 26.0–34.0)
MCHC: 33.1 g/dL (ref 32.0–36.0)
MCV: 86 fL (ref 80.0–100.0)
MONOS PCT: 6 %
Monocytes Absolute: 0.6 10*3/uL (ref 0.2–0.9)
NEUTROS ABS: 7.2 10*3/uL — AB (ref 1.4–6.5)
Neutrophils Relative %: 74 %
Platelets: 209 10*3/uL (ref 150–440)
RBC: 4.95 MIL/uL (ref 3.80–5.20)
RDW: 15.2 % — ABNORMAL HIGH (ref 11.5–14.5)
WBC: 9.8 10*3/uL (ref 3.6–11.0)

## 2017-12-18 LAB — HCG, QUANTITATIVE, PREGNANCY: HCG, BETA CHAIN, QUANT, S: 96430 m[IU]/mL — AB (ref <5)

## 2017-12-18 MED ORDER — METOCLOPRAMIDE HCL 5 MG/ML IJ SOLN
10.0000 mg | Freq: Once | INTRAMUSCULAR | Status: AC
  Administered 2017-12-18: 10 mg via INTRAVENOUS
  Filled 2017-12-18: qty 2

## 2017-12-18 MED ORDER — ONDANSETRON 4 MG PO TBDP
4.0000 mg | ORAL_TABLET | Freq: Once | ORAL | Status: AC
  Administered 2017-12-18: 4 mg via ORAL
  Filled 2017-12-18: qty 1

## 2017-12-18 MED ORDER — SODIUM CHLORIDE 0.9 % IV BOLUS (SEPSIS)
1000.0000 mL | Freq: Once | INTRAVENOUS | Status: AC
  Administered 2017-12-18: 1000 mL via INTRAVENOUS

## 2017-12-18 NOTE — ED Provider Notes (Signed)
[facility] Emergency Department Provider Note  ____________________________________________   First MD Initiated Contact with Patient 12/18/17 2004     (approximate)  I have reviewed the triage vital signs and the nursing notes.   HISTORY  Chief Complaint Emesis During Pregnancy   HPI Lori Willis is a 22 y.o. female who presents to the emergency department for treatment of nausea and vomiting.  She has a long-standing history of hyperemesis gravidarum.  She is approximately [redacted] weeks pregnant.  She started vomiting 2 days ago and has been unable to tolerate food or fluids since that time.  She has vomited "more than 6 times and then stopped counting."  She denies other symptoms of concern such as abdominal cramping, fever, dysuria, vaginal bleeding, pelvic cramping, or vaginal discharge.  She is gravida 3 para 2 and has established care at [facility].  She has taken Reglan and Zofran at home without relief.  Past Medical History:  Diagnosis Date  . Anxiety     There are no active problems to display for this patient.   Past Surgical History:  Procedure Laterality Date  . HEMORROIDECTOMY      Prior to Admission medications   Medication Sig Start Date End Date Taking? Authorizing Provider  amoxicillin (AMOXIL) 500 MG tablet Take 1 tablet (500 mg total) by mouth 2 (two) times daily. 12/03/17   Tommie Sams, DO  Doxylamine-Pyridoxine 10-10 MG TBEC Two tablets at bedtime on day 1 and 2; if symptoms persist, take 1 tablet in morning and 2 tablets at bedtime on day 3; if symptoms persist, may increase to 1 tablet in morning, 1 tablet mid-afternoon, and 2 tablets at bedtime on day 4 Patient not taking: Reported on 12/07/2017 12/03/17   Tommie Sams, DO  metoCLOPramide (REGLAN) 10 MG tablet Take 1 tablet (10 mg total) by mouth every 6 (six) hours as needed for nausea. 12/11/17 12/11/18  Doreene Burke, CNM  Prenatal Vit-Fe Fumarate-FA (PRENATAL  MULTIVITAMIN) TABS tablet Take 1 tablet by mouth daily at 12 noon.    [provider]    Allergies Benadryl [diphenhydramine]  Family History  Problem Relation Age of Onset  . Hypertension Mother   . Anemia Mother   . Diabetes Mother   . Hypertension Father     Social History Social History   Tobacco Use  . Smoking status: Never Smoker  . Smokeless tobacco: Never Used  Substance Use Topics  . Alcohol use: No    Frequency: Never  . Drug use: No    Review of Systems  Constitutional: No fever/chills Eyes: No visual changes. ENT: No sore throat. Cardiovascular: Denies chest pain. Respiratory: Denies shortness of breath. Gastrointestinal: No abdominal pain.  Positive for nausea and vomiting.  No diarrhea.  No constipation.  Genitourinary: Negative for dysuria.  Negative for vaginal bleeding or discharge. Musculoskeletal: Negative for back pain. Skin: Negative for rash. Neurological: Negative for headaches, focal weakness or numbness.  ____________________________________________   PHYSICAL EXAM:  VITAL SIGNS: ED Triage Vitals  Enc Vitals Group     BP 12/18/17 1845 129/90     Pulse Rate 12/18/17 1845 (!) 110     Resp 12/18/17 1845 18     Temp 12/18/17 1845 98.2 F (36.8 C)     Temp Source 12/18/17 1845 Oral     SpO2 12/18/17 1845 99 %     Weight 12/18/17 1846 140 lb (63.5 kg)     Height 12/18/17 1846 5\' 3"  (1.6  m)     Head Circumference --      Peak Flow --      Pain Score 12/18/17 1910 0     Pain Loc --      Pain Edu? --      Excl. in GC? --     Constitutional: Alert and oriented. Well appearing and in no acute distress. Eyes: Conjunctivae are normal. PERRL. EOMI. Head: Atraumatic. Nose: No congestion/rhinnorhea. Mouth/Throat: Mucous membranes are moist.  Oropharynx non-erythematous. Neck: No stridor.   Cardiovascular: Normal rate, regular rhythm. Grossly normal heart sounds.  Good peripheral circulation. Respiratory: Normal respiratory  effort.  No retractions. Lungs CTAB. Gastrointestinal: Gravid.  Soft and nontender. Musculoskeletal: No lower extremity tenderness nor edema.  No joint effusions. Neurologic:  Normal speech and language. No gross focal neurologic deficits are appreciated. No gait instability. Skin:  Skin is warm, dry and intact. No rash noted. Psychiatric: Mood and affect are normal. Speech and behavior are normal.  ____________________________________________   LABS (all labs ordered are listed, but only abnormal results are displayed)  Labs Reviewed  CBC WITH DIFFERENTIAL/PLATELET - Abnormal; Notable for the following components:      Result Value   RDW 15.2 (*)    Neutro Abs 7.2 (*)    All other components within normal limits  HCG, QUANTITATIVE, PREGNANCY - Abnormal; Notable for the following components:   hCG, Beta Chain, Quant, S 96,430 (*)    All other components within normal limits  BASIC METABOLIC PANEL - Abnormal; Notable for the following components:   Potassium 3.3 (*)    All other components within normal limits  URINALYSIS, COMPLETE (UACMP) WITH MICROSCOPIC - Abnormal; Notable for the following components:   Color, Urine YELLOW (*)    APPearance CLOUDY (*)    Ketones, ur 80 (*)    Protein, ur 100 (*)    Squamous Epithelial / LPF 6-30 (*)    All other components within normal limits   ____________________________________________  EKG  Not indicated ____________________________________________  RADIOLOGY  ED MD interpretation: Not indicated  Official radiology report(s): No results found.  ____________________________________________   PROCEDURES  Procedure(s) performed: None  Procedures  Critical Care performed: No  ____________________________________________   INITIAL IMPRESSION / ASSESSMENT AND PLAN / ED COURSE  As part of my medical decision making, I reviewed the following data within the electronic MEDICAL RECORD NUMBER    22 year old female presents to  the emergency department for symptoms and exam consistent with recurrent hyperemesis gravidarum.  1 L of normal saline is infusing.  She was given Reglan IV.  Patient states that she has had significant relief with both.  She is requesting discharge.  She refused influenza testing today.  Patient was able to tolerate ice chips without vomiting.  Patient was encouraged to gradually increase the amount of fluids that she is taking in.  She was encouraged to call Monday to schedule an appointment to see her OB/GYN and recheck her electrolytes and urine.  She was advised that she should return to the emergency department if she begins to vomit and has no relief with the Reglan or Zofran that she has at home.  No additional prescriptions will be given today.   ____________________________________________   FINAL CLINICAL IMPRESSION(S) / ED DIAGNOSES  Final diagnoses:  Hyperemesis gravidarum before end of [redacted] week gestation with electrolyte imbalance  Proteinuria affecting pregnancy in first trimester     ED Discharge Orders    None  Note:  This document was prepared using Dragon voice recognition software and may include unintentional dictation errors.    Chinita Pester, FNP 12/18/17 2246    Willy Eddy, MD 12/19/17 [phone]

## 2017-12-18 NOTE — ED Notes (Signed)
Pt states she does not want to be flu tested, states she was tested 2 wks ago and it was negative. Pt states the test burns her nose and that she does not get out of the house. NP notified.

## 2017-12-18 NOTE — Discharge Instructions (Signed)
Call to schedule an appointment with your OB GYN for Monday or Tuesday. You will need to have your urine rechecked.  If you start vomiting again and the medications are not working, return to the ER.

## 2017-12-18 NOTE — ED Triage Notes (Addendum)
Pt arrives POV from home with c/o hyperemesis. Pt EDD is 07/14/2018. Pt reports not being able to keep anything down x 2 days. Pt is in NAD at this time.

## 2017-12-18 NOTE — ED Notes (Signed)
Pt states with every pregnancy she has hyperemesis gravidarum and is seen here for fluids and medication. Pt states this is her third pregnancy and is 10 wks preg. Pt states she "stopped counting after 6 times" throwing up today. Denies abd pain.

## 2017-12-23 ENCOUNTER — Ambulatory Visit (INDEPENDENT_AMBULATORY_CARE_PROVIDER_SITE_OTHER): Payer: Self-pay | Admitting: Certified Nurse Midwife

## 2017-12-23 VITALS — BP 114/78 | HR 99 | Ht 63.0 in | Wt 141.1 lb

## 2017-12-23 DIAGNOSIS — Z3481 Encounter for supervision of other normal pregnancy, first trimester: Secondary | ICD-10-CM

## 2017-12-23 LAB — OB RESULTS CONSOLE VARICELLA ZOSTER ANTIBODY, IGG: VARICELLA IGG: IMMUNE

## 2017-12-23 NOTE — Progress Notes (Signed)
Lori Willis presents for NOB nurse interview visit. Pregnancy confirmation done _1/18/19 [facility]_____.  G- 4.  P- 2 0 1 1   . Pregnancy education material explained and given. __0_ cats in the home. NOB labs ordered. HIV labs and Drug screen were explained optional and she did not decline. Drug screen ordered. PNV encouraged. Genetic screening options discussed. Genetic testing: Ordered/Declined/Unsure.  Pt may discuss with provider. Pt. To follow up with provider in _1-2_ weeks for NOB physical.  All questions answered.

## 2017-12-24 LAB — CBC WITH DIFFERENTIAL
BASOS ABS: 0 10*3/uL (ref 0.0–0.2)
Basos: 0 %
EOS (ABSOLUTE): 0 10*3/uL (ref 0.0–0.4)
EOS: 0 %
HEMATOCRIT: 42.4 % (ref 34.0–46.6)
Hemoglobin: 13.7 g/dL (ref 11.1–15.9)
IMMATURE GRANULOCYTES: 0 %
Immature Grans (Abs): 0 10*3/uL (ref 0.0–0.1)
LYMPHS ABS: 1.1 10*3/uL (ref 0.7–3.1)
Lymphs: 11 %
MCH: 28.5 pg (ref 26.6–33.0)
MCHC: 32.3 g/dL (ref 31.5–35.7)
MCV: 88 fL (ref 79–97)
MONOS ABS: 0.4 10*3/uL (ref 0.1–0.9)
Monocytes: 4 %
NEUTROS PCT: 85 %
Neutrophils Absolute: 8.8 10*3/uL — ABNORMAL HIGH (ref 1.4–7.0)
RBC: 4.81 x10E6/uL (ref 3.77–5.28)
RDW: 15.5 % — AB (ref 12.3–15.4)
WBC: 10.4 10*3/uL (ref 3.4–10.8)

## 2017-12-24 LAB — RUBELLA SCREEN: Rubella Antibodies, IGG: 3.1 index (ref >0.99)

## 2017-12-24 LAB — HIV ANTIBODY (ROUTINE TESTING W REFLEX): HIV SCREEN 4TH GENERATION: NONREACTIVE

## 2017-12-24 LAB — URINALYSIS, ROUTINE W REFLEX MICROSCOPIC
BILIRUBIN UA: NEGATIVE
Glucose, UA: NEGATIVE
LEUKOCYTES UA: NEGATIVE
Nitrite, UA: NEGATIVE
PH UA: 5.5 (ref 5.0–7.5)
RBC UA: NEGATIVE
Specific Gravity, UA: 1.029 (ref 1.005–1.030)
Urobilinogen, Ur: 0.2 mg/dL (ref 0.2–1.0)

## 2017-12-24 LAB — GC/CHLAMYDIA PROBE AMP
CHLAMYDIA, DNA PROBE: NEGATIVE
NEISSERIA GONORRHOEAE BY PCR: NEGATIVE

## 2017-12-24 LAB — ABO AND RH: RH TYPE: POSITIVE

## 2017-12-24 LAB — ANTIBODY SCREEN: ANTIBODY SCREEN: NEGATIVE

## 2017-12-24 LAB — HEPATITIS B SURFACE ANTIGEN: Hepatitis B Surface Ag: NEGATIVE

## 2017-12-24 LAB — RPR: RPR Ser Ql: NONREACTIVE

## 2017-12-24 LAB — VARICELLA ZOSTER ANTIBODY, IGG: VARICELLA: 1373 {index} (ref >165)

## 2017-12-25 LAB — MONITOR DRUG PROFILE 14(MW)
Amphetamine Scrn, Ur: NEGATIVE ng/mL
BARBITURATE SCREEN URINE: NEGATIVE ng/mL
BENZODIAZEPINE SCREEN, URINE: NEGATIVE ng/mL
BUPRENORPHINE, URINE: NEGATIVE ng/mL
CANNABINOIDS UR QL SCN: NEGATIVE ng/mL
CREATININE(CRT), U: 275.6 mg/dL (ref 20.0–300.0)
Cocaine (Metab) Scrn, Ur: NEGATIVE ng/mL
Fentanyl, Urine: NEGATIVE pg/mL
METHADONE SCREEN, URINE: NEGATIVE ng/mL
Meperidine Screen, Urine: NEGATIVE ng/mL
OXYCODONE+OXYMORPHONE UR QL SCN: NEGATIVE ng/mL
Opiate Scrn, Ur: NEGATIVE ng/mL
PH UR, DRUG SCRN: 5.8 (ref 4.5–8.9)
Phencyclidine Qn, Ur: NEGATIVE ng/mL
Propoxyphene Scrn, Ur: NEGATIVE ng/mL
SPECIFIC GRAVITY: 1.027
Tramadol Screen, Urine: NEGATIVE ng/mL

## 2017-12-25 LAB — NICOTINE SCREEN, URINE: COTININE UR QL SCN: NEGATIVE ng/mL

## 2017-12-25 LAB — URINE CULTURE: ORGANISM ID, BACTERIA: NO GROWTH

## 2017-12-29 ENCOUNTER — Telehealth: Payer: Self-pay | Admitting: Certified Nurse Midwife

## 2017-12-29 NOTE — Telephone Encounter (Signed)
The patient would like to have a call back in regards to her Panorama results. No other information was disclosed. Please advise.

## 2017-12-30 ENCOUNTER — Encounter: Payer: Self-pay | Admitting: Certified Nurse Midwife

## 2017-12-30 ENCOUNTER — Ambulatory Visit (INDEPENDENT_AMBULATORY_CARE_PROVIDER_SITE_OTHER): Payer: Medicaid Other | Admitting: Certified Nurse Midwife

## 2017-12-30 VITALS — BP 109/73 | HR 101 | Wt 142.6 lb

## 2017-12-30 DIAGNOSIS — Z3481 Encounter for supervision of other normal pregnancy, first trimester: Secondary | ICD-10-CM

## 2017-12-30 LAB — POCT URINALYSIS DIPSTICK
BILIRUBIN UA: NEGATIVE
GLUCOSE UA: NEGATIVE
Ketones, UA: NEGATIVE
LEUKOCYTES UA: NEGATIVE
Nitrite, UA: NEGATIVE
PH UA: 8.5 — AB (ref 5.0–8.0)
Protein, UA: NEGATIVE
RBC UA: NEGATIVE
Spec Grav, UA: 1.01 (ref 1.010–1.025)
UROBILINOGEN UA: 0.2 U/dL

## 2017-12-30 NOTE — Patient Instructions (Signed)
Eating Plan for Pregnant Women While you are pregnant, your body will require additional nutrition to help support your growing baby. It is recommended that you consume:  150 additional calories each day during your first trimester.  300 additional calories each day during your second trimester.  300 additional calories each day during your third trimester.  Eating a healthy, well-balanced diet is very important for your health and for your baby's health. You also have a higher need for some vitamins and minerals, such as folic acid, calcium, iron, and vitamin D. What do I need to know about eating during pregnancy?  Do not try to lose weight or go on a diet during pregnancy.  Choose healthy, nutritious foods. Choose  of a sandwich with a glass of milk instead of a candy bar or a high-calorie sugar-sweetened beverage.  Limit your overall intake of foods that have "empty calories." These are foods that have little nutritional value, such as sweets, desserts, candies, sugar-sweetened beverages, and fried foods.  Eat a variety of foods, especially fruits and vegetables.  Take a prenatal vitamin to help meet the additional needs during pregnancy, specifically for folic acid, iron, calcium, and vitamin D.  Remember to stay active. Ask your health care provider for exercise recommendations that are specific to you.  Practice good food safety and cleanliness, such as washing your hands before you eat and after you prepare raw meat. This helps to prevent foodborne illnesses, such as listeriosis, that can be very dangerous for your baby. Ask your health care provider for more information about listeriosis. What does 150 extra calories look like? Healthy options for an additional 150 calories each day could be any of the following:  Plain low-fat yogurt (6-8 oz) with  cup of berries.  1 apple with 2 teaspoons of peanut butter.  Cut-up vegetables with  cup of hummus.  Low-fat chocolate milk  (8 oz or 1 cup).  1 string cheese with 1 medium orange.   of a peanut butter and jelly sandwich on whole-wheat bread (1 tsp of peanut butter).  For 300 calories, you could eat two of those healthy options each day. What is a healthy amount of weight to gain? The recommended amount of weight for you to gain is based on your pre-pregnancy BMI. If your pre-pregnancy BMI was:  Less than 18 (underweight), you should gain 28-40 lb.  18-24.9 (normal), you should gain 25-35 lb.  25-29.9 (overweight), you should gain 15-25 lb.  Greater than 30 (obese), you should gain 11-20 lb.  What if I am having twins or multiples? Generally, pregnant women who will be having twins or multiples may need to increase their daily calories by 300-600 calories each day. The recommended range for total weight gain is 25-54 lb, depending on your pre-pregnancy BMI. Talk with your health care provider for specific guidance about additional nutritional needs, weight gain, and exercise during your pregnancy. What foods can I eat? Grains Any grains. Try to choose whole grains, such as whole-wheat bread, oatmeal, or brown rice. Vegetables Any vegetables. Try to eat a variety of colors and types of vegetables to get a full range of vitamins and minerals. Remember to wash your vegetables well before eating. Fruits Any fruits. Try to eat a variety of colors and types of fruit to get a full range of vitamins and minerals. Remember to wash your fruits well before eating. Meats and Other Protein Sources Lean meats, including chicken, turkey, fish, and lean cuts of beef, veal,   or pork. Make sure that all meats are cooked to "well done." Tofu. Tempeh. Beans. Eggs. Peanut butter and other nut butters. Seafood, such as shrimp, crab, and lobster. If you choose fish, select types that are higher in omega-3 fatty acids, including salmon, herring, mussels, trout, sardines, and pollock. Make sure that all meats are cooked to food-safe  temperatures. Dairy Pasteurized milk and milk alternatives. Pasteurized yogurt and pasteurized cheese. Cottage cheese. Sour cream. Beverages Water. Juices that contain 100% fruit juice or vegetable juice. Caffeine-free teas and decaffeinated coffee. Drinks that contain caffeine are okay to drink, but it is better to avoid caffeine. Keep your total caffeine intake to less than 200 mg each day (12 oz of coffee, tea, or soda) or as directed by your health care provider. Condiments Any pasteurized condiments. Sweets and Desserts Any sweets and desserts. Fats and Oils Any fats and oils. The items listed above may not be a complete list of recommended foods or beverages. Contact your dietitian for more options. What foods are not recommended? Vegetables Unpasteurized (raw) vegetable juices. Fruits Unpasteurized (raw) fruit juices. Meats and Other Protein Sources Cured meats that have nitrates, such as bacon, salami, and hotdogs. Luncheon meats, bologna, or other deli meats (unless they are reheated until they are steaming hot). Refrigerated pate, meat spreads from a meat counter, smoked seafood that is found in the refrigerated section of a store. Raw fish, such as sushi or sashimi. High mercury content fish, such as tilefish, shark, swordfish, and king mackerel. Raw meats, such as tuna or beef tartare. Undercooked meats and poultry. Make sure that all meats are cooked to food-safe temperatures. Dairy Unpasteurized (raw) milk and any foods that have raw milk in them. Soft cheeses, such as feta, queso blanco, queso fresco, Brie, Camembert cheeses, blue-veined cheeses, and Panela cheese (unless it is made with pasteurized milk, which must be stated on the label). Beverages Alcohol. Sugar-sweetened beverages, such as sodas, teas, or energy drinks. Condiments Homemade fermented foods and drinks, such as pickles, sauerkraut, or kombucha drinks. (Store-bought pasteurized versions of these are  okay.) Other Salads that are made in the store, such as ham salad, chicken salad, egg salad, tuna salad, and seafood salad. The items listed above may not be a complete list of foods and beverages to avoid. Contact your dietitian for more information. This information is not intended to replace advice given to you by your health care provider. Make sure you discuss any questions you have with your health care provider. Document Released: 08/04/2014 Document Revised: 03/27/2016 Document Reviewed: 04/04/2014 Elsevier Interactive Patient Education  2018 [employer] Prenatal Care WHAT IS PRENATAL CARE? Prenatal care is the process of caring for a pregnant woman before she gives birth. Prenatal care makes sure that she and her baby remain as healthy as possible throughout pregnancy. Prenatal care may be provided by a midwife, family practice health care provider, or a childbirth and pregnancy specialist (obstetrician). Prenatal care may include physical examinations, testing, treatments, and education on nutrition, lifestyle, and social support services. WHY IS PRENATAL CARE SO IMPORTANT? Early and consistent prenatal care increases the chance that you and your baby will remain healthy throughout your pregnancy. This type of care also decreases a baby's risk of being born too early (prematurely), or being born smaller than expected (small for gestational age). Any underlying medical conditions you may have that could pose a risk during your pregnancy are discussed during prenatal care visits. You will also be monitored regularly for any new   conditions that may arise during your pregnancy so they can be treated quickly and effectively. WHAT HAPPENS DURING PRENATAL CARE VISITS? Prenatal care visits may include the following: Discussion Tell your health care provider about any new signs or symptoms you have experienced since your last visit. These might include:  Nausea or vomiting.  Increased or  decreased level of energy.  Difficulty sleeping.  Back or leg pain.  Weight changes.  Frequent urination.  Shortness of breath with physical activity.  Changes in your skin, such as the development of a rash or itchiness.  Vaginal discharge or bleeding.  Feelings of excitement or nervousness.  Changes in your baby's movements.  You may want to write down any questions or topics you want to discuss with your health care provider and bring them with you to your appointment. Examination During your first prenatal care visit, you will likely have a complete physical exam. Your health care provider will often examine your vagina, cervix, and the position of your uterus, as well as check your heart, lungs, and other body systems. As your pregnancy progresses, your health care provider will measure the size of your uterus and your baby's position inside your uterus. He or she may also examine you for early signs of labor. Your prenatal visits may also include checking your blood pressure and, after about 10-12 weeks of pregnancy, listening to your baby's heartbeat. Testing Regular testing often includes:  Urinalysis. This checks your urine for glucose, protein, or signs of infection.  Blood count. This checks the levels of white and red blood cells in your body.  Tests for sexually transmitted infections (STIs). Testing for STIs at the beginning of pregnancy is routinely done and is required in many states.  Antibody testing. You will be checked to see if you are immune to certain illnesses, such as rubella, that can affect a developing fetus.  Glucose screen. Around 24-28 weeks of pregnancy, your blood glucose level will be checked for signs of gestational diabetes. Follow-up tests may be recommended.  Group B strep. This is a bacteria that is commonly found inside a woman's vagina. This test will inform your health care provider if you need an antibiotic to reduce the amount of this  bacteria in your body prior to labor and childbirth.  Ultrasound. Many pregnant women undergo an ultrasound screening around 18-20 weeks of pregnancy to evaluate the health of the fetus and check for any developmental abnormalities.  HIV (human immunodeficiency virus) testing. Early in your pregnancy, you will be screened for HIV. If you are at high risk for HIV, this test may be repeated during your third trimester of pregnancy.  You may be offered other testing based on your age, personal or family medical history, or other factors. HOW OFTEN SHOULD I PLAN TO SEE MY HEALTH CARE PROVIDER FOR PRENATAL CARE? Your prenatal care check-up schedule depends on any medical conditions you have before, or develop during, your pregnancy. If you do not have any underlying medical conditions, you will likely be seen for checkups:  Monthly, during the first 6 months of pregnancy.  Twice a month during months 7 and 8 of pregnancy.  Weekly starting in the 9th month of pregnancy and until delivery.  If you develop signs of early labor or other concerning signs or symptoms, you may need to see your health care provider more often. Ask your health care provider what prenatal care schedule is best for you. WHAT CAN I DO TO KEEP MYSELF AND   MY BABY AS HEALTHY AS POSSIBLE DURING MY PREGNANCY?  Take a prenatal vitamin containing 400 micrograms (0.4 mg) of folic acid every day. Your health care provider may also ask you to take additional vitamins such as iodine, vitamin D, iron, copper, and zinc.  Take 1500-2000 mg of calcium daily starting at your 20th week of pregnancy until you deliver your baby.  Make sure you are up to date on your vaccinations. Unless directed otherwise by your health care provider: ? You should receive a tetanus, diphtheria, and pertussis (Tdap) vaccination between the 27th and 36th week of your pregnancy, regardless of when your last Tdap immunization occurred. This helps protect your baby  from whooping cough (pertussis) after he or she is born. ? You should receive an annual inactivated influenza vaccine (IIV) to help protect you and your baby from influenza. This can be done at any point during your pregnancy.  Eat a well-rounded diet that includes: ? Fresh fruits and vegetables. ? Lean proteins. ? Calcium-rich foods such as milk, yogurt, hard cheeses, and dark, leafy greens. ? Whole grain breads.  Do noteat seafood high in mercury, including: ? Swordfish. ? Tilefish. ? Shark. ? King mackerel. ? More than 6 oz tuna per week.  Do not eat: ? Raw or undercooked meats or eggs. ? Unpasteurized foods, such as soft cheeses (brie, blue, or feta), juices, and milks. ? Lunch meats. ? Hot dogs that have not been heated until they are steaming.  Drink enough water to keep your urine clear or pale yellow. For many women, this may be 10 or more 8 oz glasses of water each day. Keeping yourself hydrated helps deliver nutrients to your baby and may prevent the start of pre-term uterine contractions.  Do not use any tobacco products including cigarettes, chewing tobacco, or electronic cigarettes. If you need help quitting, ask your health care provider.  Do not drink beverages containing alcohol. No safe level of alcohol consumption during pregnancy has been determined.  Do not use any illegal drugs. These can harm your developing baby or cause a miscarriage.  Ask your health care provider or pharmacist before taking any prescription or over-the-counter medicines, herbs, or supplements.  Limit your caffeine intake to no more than 200 mg per day.  Exercise. Unless told otherwise by your health care provider, try to get 30 minutes of moderate exercise most days of the week. Do not  do high-impact activities, contact sports, or activities with a high risk of falling, such as horseback riding or downhill skiing.  Get plenty of rest.  Avoid anything that raises your body temperature,  such as hot tubs and saunas.  If you own a cat, do not empty its litter box. Bacteria contained in cat feces can cause an infection called toxoplasmosis. This can result in serious harm to the fetus.  Stay away from chemicals such as insecticides, lead, mercury, and cleaning or paint products that contain solvents.  Do not have any X-rays taken unless medically necessary.  Take a childbirth and breastfeeding preparation class. Ask your health care provider if you need a referral or recommendation.  This information is not intended to replace advice given to you by your health care provider. Make sure you discuss any questions you have with your health care provider. Document Released: 10/23/2003 Document Revised: 03/24/2016 Document Reviewed: 01/04/2014 Elsevier Interactive Patient Education  2017 Elsevier Inc.  

## 2017-12-30 NOTE — Progress Notes (Signed)
NEW OB HISTORY AND PHYSICAL  SUBJECTIVE:       Lori Willis is a 22 y.o. G90P0010 female, Patient's last menstrual period was 10/07/2017 (exact date)., Estimated Date of Delivery: 07/14/18, [redacted]w[redacted]d, presents today for establishment of Prenatal Care. She complains of nausea.  Diclegis did not work for her.She is taking Reglan   Gynecologic History Patient's last menstrual period was 10/07/2017 (exact date). Normal Contraception: none Last Pap: last year Results were: normal per pt. Can not remember where she had it done to send medical release form.   Obstetric History OB History  Gravida Para Term Preterm AB Living  4 2     1 2   SAB TAB Ectopic Multiple Live Births    1     2    # Outcome Date GA Lbr Len/2nd Weight Sex Delivery Anes PTL Lv  4 Current           3 Para 2017    F Vag-Spont     2 Para 2015    M Vag-Spont     1 TAB 2013              Past Medical History:  Diagnosis Date  . Anxiety     Past Surgical History:  Procedure Laterality Date  . HEMORROIDECTOMY      Current Outpatient Medications on File Prior to Visit  Medication Sig Dispense Refill  . metoCLOPramide (REGLAN) 10 MG tablet Take 1 tablet (10 mg total) by mouth every 6 (six) hours as needed for nausea. 12 tablet 0  . Prenatal Vit-Fe Fumarate-FA (PRENATAL MULTIVITAMIN) TABS tablet Take 1 tablet by mouth daily at 12 noon.     No current facility-administered medications on file prior to visit.     Allergies  Allergen Reactions  . Benadryl [Diphenhydramine] Anaphylaxis  . Camphor Anaphylaxis    Social History   Socioeconomic History  . Marital status: Married    Spouse name: Not on file  . Number of children: Not on file  . Years of education: Not on file  . Highest education level: Not on file  Social Needs  . Financial resource strain: Not on file  . Food insecurity - worry: Not on file  . Food insecurity - inability: Not on file  . Transportation needs - medical: Not on file  .  Transportation needs - non-medical: Not on file  Occupational History  . Not on file  Tobacco Use  . Smoking status: Never Smoker  . Smokeless tobacco: Never Used  Substance and Sexual Activity  . Alcohol use: No    Frequency: Never  . Drug use: No  . Sexual activity: Yes    Birth control/protection: None  Other Topics Concern  . Not on file  Social History Narrative  . Not on file    Family History  Problem Relation Age of Onset  . Hypertension Mother   . Anemia Mother   . Diabetes Mother   . Hypertension Father     The following portions of the patient's history were reviewed and updated as appropriate: allergies, current medications, past OB history, past medical history, past surgical history, past family history, past social history, and problem list.   OBJECTIVE: Initial Physical Exam (New OB)  GENERAL APPEARANCE: alert, well appearing, in no apparent distress, oriented to person, place and time HEAD: normocephalic, atraumatic MOUTH: mucous membranes moist, pharynx normal without lesions THYROID: no thyromegaly or masses present BREASTS: no masses noted, no significant tenderness, no palpable axillary nodes,  no skin changes LUNGS: clear to auscultation, no wheezes, rales or rhonchi, symmetric air entry HEART: regular rate and rhythm, no murmurs ABDOMEN: soft, nontender, nondistended, no abnormal masses, no epigastric pain and fundus soft, nontender 12 weeks size EXTREMITIES: no redness or tenderness in the calves or thighs, no edema, no limitation in range of motion, intact peripheral pulses SKIN: normal coloration and turgor, no rashes LYMPH NODES: no adenopathy palpable NEUROLOGIC: alert, oriented, normal speech, no focal findings or movement disorder noted  PELVIC EXAM EXTERNAL GENITALIA: normal appearing vulva with no masses, tenderness or lesions VAGINA: no abnormal discharge or lesions CERVIX: no lesions or cervical motion tenderness and contact bleeding  with pap smear UTERUS: gravid and consistent with 12 weeks ADNEXA: no masses palpable and nontender OB EXAM PELVIMETRY: appears adequate RECTUM: exam not indicated  ASSESSMENT: Normal pregnancy   PLAN: New OB counseling: The patient has been given an overview regarding routine prenatal care. Recommendations regarding diet, weight gain, and exercise in pregnancy were given. Prenatal testing, optional genetic testing, and ultrasound use in pregnancy were reviewed. Benefits of Breast Feeding were discussed. The patient is encouraged to consider nursing her baby post partum.  Doreene BurkeAnnie Andrea Ferrer, CNM

## 2017-12-31 ENCOUNTER — Encounter: Payer: Self-pay | Admitting: Certified Nurse Midwife

## 2018-01-02 ENCOUNTER — Other Ambulatory Visit: Payer: Self-pay | Admitting: Certified Nurse Midwife

## 2018-01-04 ENCOUNTER — Other Ambulatory Visit: Payer: Self-pay

## 2018-01-04 MED ORDER — METOCLOPRAMIDE HCL 10 MG PO TABS: 10.0000 mg | ORAL_TABLET | Freq: Four times a day (QID) | ORAL | 0 refills | Status: DC | PRN

## 2018-01-05 ENCOUNTER — Telehealth: Payer: Self-pay | Admitting: Certified Nurse Midwife

## 2018-01-05 NOTE — Telephone Encounter (Signed)
PT notified of genetic test results and gender.   Lori BurkeAnnie Tashera Willis, CNM

## 2018-01-06 ENCOUNTER — Encounter: Payer: Self-pay | Admitting: Certified Nurse Midwife

## 2018-01-06 LAB — PAP IG W/ RFLX HPV ASCU: PAP Smear Comment: 0

## 2018-01-08 ENCOUNTER — Other Ambulatory Visit: Payer: Self-pay | Admitting: *Deleted

## 2018-01-08 ENCOUNTER — Other Ambulatory Visit: Payer: Self-pay | Admitting: Certified Nurse Midwife

## 2018-01-08 ENCOUNTER — Encounter: Payer: Self-pay | Admitting: Certified Nurse Midwife

## 2018-01-08 MED ORDER — ONDANSETRON 4 MG PO TBDP: 4.0000 mg | ORAL_TABLET | Freq: Three times a day (TID) | ORAL | 3 refills | Status: DC | PRN

## 2018-01-08 MED ORDER — METOCLOPRAMIDE HCL 10 MG PO TABS: 10.0000 mg | ORAL_TABLET | Freq: Four times a day (QID) | ORAL | 0 refills | Status: DC | PRN

## 2018-01-18 ENCOUNTER — Encounter: Payer: Self-pay | Admitting: Certified Nurse Midwife

## 2018-01-25 ENCOUNTER — Encounter: Payer: Self-pay | Admitting: Certified Nurse Midwife

## 2018-01-26 NOTE — Telephone Encounter (Signed)
Jasmine DecemberSharon,   She can come in and be seen if she would like to. There is not a whole lot we can do about migraines in pregnancy . It is a common occurrence that patients that have never have these type headaches prior to pregnancy can have them due to hormonal changes that occur in pregnancy. We can prescribe her  Fioricet. Of course she has to understand and agree to the risk because it does go to the baby. So I would tell her that she can come in if she would like to discuss medication option of Fioricet. Thanks,  Pattricia BossAnnie

## 2018-01-28 ENCOUNTER — Other Ambulatory Visit: Payer: Self-pay | Admitting: Certified Nurse Midwife

## 2018-01-28 ENCOUNTER — Encounter: Payer: Medicaid Other | Admitting: Certified Nurse Midwife

## 2018-01-29 ENCOUNTER — Other Ambulatory Visit: Payer: Self-pay

## 2018-01-29 MED ORDER — METOCLOPRAMIDE HCL 10 MG PO TABS: 10.0000 mg | ORAL_TABLET | Freq: Four times a day (QID) | ORAL | 0 refills | Status: DC | PRN

## 2018-02-02 ENCOUNTER — Encounter: Payer: Medicaid Other | Admitting: Certified Nurse Midwife

## 2018-03-08 ENCOUNTER — Other Ambulatory Visit: Payer: Self-pay | Admitting: Obstetrics and Gynecology

## 2018-03-08 ENCOUNTER — Telehealth: Payer: Self-pay | Admitting: Obstetrics and Gynecology

## 2018-03-08 DIAGNOSIS — Z3689 Encounter for other specified antenatal screening: Secondary | ICD-10-CM

## 2018-03-08 NOTE — Telephone Encounter (Signed)
Pt has an appt made this week for rob and anatomy.

## 2018-03-08 NOTE — Telephone Encounter (Signed)
The patient possibly needs an order put in for an anatomy scan, The patient called to schedule an appointment, She has not been seen since 12/30/17, The patient stated [place] and has not had any prenatal care since her last office visit her at our office. Please advise.

## 2018-03-10 ENCOUNTER — Ambulatory Visit (INDEPENDENT_AMBULATORY_CARE_PROVIDER_SITE_OTHER): Payer: Medicaid Other

## 2018-03-10 ENCOUNTER — Ambulatory Visit (INDEPENDENT_AMBULATORY_CARE_PROVIDER_SITE_OTHER): Payer: Medicaid Other | Admitting: Obstetrics and Gynecology

## 2018-03-10 ENCOUNTER — Encounter: Payer: Self-pay | Admitting: Obstetrics and Gynecology

## 2018-03-10 VITALS — BP 98/62 | HR 99 | Wt 146.1 lb

## 2018-03-10 DIAGNOSIS — Z3689 Encounter for other specified antenatal screening: Secondary | ICD-10-CM | POA: Diagnosis not present

## 2018-03-10 DIAGNOSIS — Z3482 Encounter for supervision of other normal pregnancy, second trimester: Secondary | ICD-10-CM

## 2018-03-10 NOTE — Progress Notes (Signed)
ROB: Patient is transferred from the midwife side.  She has no specific complaints today.  She has described lightheaded episodes that she has experienced with all of her pregnancies and has learned to manage.  FAS today.

## 2018-03-10 NOTE — Progress Notes (Signed)
ROB and anatomy.

## 2018-03-13 ENCOUNTER — Encounter: Payer: Self-pay | Admitting: Obstetrics and Gynecology

## 2018-03-25 ENCOUNTER — Ambulatory Visit
Admission: EM | Admit: 2018-03-25 | Discharge: 2018-03-25 | Disposition: A | Discharge status: Home / Self Care | Payer: Medicaid Other | Attending: Family Medicine | Admitting: Family Medicine

## 2018-03-25 ENCOUNTER — Encounter: Payer: Self-pay | Admitting: Emergency Medicine

## 2018-03-25 ENCOUNTER — Other Ambulatory Visit: Payer: Self-pay

## 2018-03-25 DIAGNOSIS — K0889 Other specified disorders of teeth and supporting structures: Secondary | ICD-10-CM | POA: Diagnosis not present

## 2018-03-25 MED ORDER — AMOXICILLIN-POT CLAVULANATE 875-125 MG PO TABS: 1.0000 | ORAL_TABLET | Freq: Two times a day (BID) | ORAL | 0 refills | Status: DC

## 2018-03-25 NOTE — Discharge Instructions (Signed)
Antibiotic as needed.  Tylenol as needed.  Call dentist.  Take care  Dr. Adriana Simas

## 2018-03-25 NOTE — ED Triage Notes (Signed)
Patient states that she broke off a tooth on left upper jaw about 3-4 days ago.  Patient c/o pain on the left side of her face.

## 2018-03-25 NOTE — ED Provider Notes (Signed)
[facility]    CSN: [id number] Arrival date & time: 03/25/18  1359  History   Chief Complaint Chief Complaint  Patient presents with  . Dental Pain   HPI  22 year old female presents with dental pain.  Patient reports that few days ago she broke a tooth.  She states that yesterday she developed severe pain.  She reports ongoing severe dental pain.  Sharp, intermittent.  Associated jaw discomfort.  No fevers or chills.  No known relieving factors.  No other associated symptoms.  No other complaints.  Past Medical History:  Diagnosis Date  . Anxiety    Past Surgical History:  Procedure Laterality Date  . HEMORROIDECTOMY      OB History    Gravida  4   Para  2   Term      Preterm      AB  1   Living  2     SAB      TAB  1   Ectopic      Multiple      Live Births  2          Home Medications    Prior to Admission medications   Medication Sig Start Date End Date Taking? Authorizing Provider  Prenatal Vit-Fe Fumarate-FA (PRENATAL MULTIVITAMIN) TABS tablet Take 1 tablet by mouth daily at 12 noon.   Yes [provider]  amoxicillin-clavulanate (AUGMENTIN) 875-125 MG tablet Take 1 tablet by mouth every 12 (twelve) hours. 03/25/18   Tommie Sams, DO    Family History Family History  Problem Relation Age of Onset  . Hypertension Mother   . Anemia Mother   . Diabetes Mother   . Hypertension Father     Social History Social History   Tobacco Use  . Smoking status: Never Smoker  . Smokeless tobacco: Never Used  Substance Use Topics  . Alcohol use: No    Frequency: Never  . Drug use: No     Allergies   Benadryl [diphenhydramine]   Review of Systems Review of Systems  Constitutional: Negative.   HENT: Positive for dental problem.    Physical Exam Triage Vital Signs ED Triage Vitals  Enc Vitals Group     BP 03/25/18 1430 110/72     Pulse Rate 03/25/18 1430 (!) 102     Resp 03/25/18 1430 16     Temp 03/25/18 1430  98.7 F (37.1 C)     Temp Source 03/25/18 1430 Oral     SpO2 03/25/18 1430 99 %     Weight 03/25/18 1428 146 lb (66.2 kg)     Height 03/25/18 1428  (1.6 m)     Head Circumference --      Peak Flow --      Pain Score 03/25/18 1428 4     Pain Loc --      Pain Edu? --      Excl. in GC? --    Updated Vital Signs BP 110/72 (BP Location: Left Arm)   Pulse (!) 102   Temp 98.7 F (37.1 C) (Oral)   Resp 16   Ht  (1.6 m)   Wt 146 lb (66.2 kg)   LMP 10/07/2017 (Exact Date)   SpO2 99%   BMI 25.86 kg/m   Physical Exam  Constitutional: She is oriented to person, place, and time. She appears well-developed. No distress.  HENT:  Mouth/Throat:    Oropharynx clear. Labeled tooth with arrow -broken tooth noted. Other  labeled tooth decaying.  Cardiovascular: Normal rate and regular rhythm.  Pulmonary/Chest: Effort normal and breath sounds normal. She has no wheezes. She has no rales.  Neurological: She is alert and oriented to person, place, and time.  Psychiatric: She has a normal mood and affect. Her behavior is normal.  Nursing note and vitals reviewed.  UC Treatments / Results  Labs (all labs ordered are listed, but only abnormal results are displayed) Labs Reviewed - No data to display  EKG None  Radiology No results found.  Procedures Procedures (including critical care time)  Medications Ordered in UC Medications - No data to display  Initial Impression / Assessment and Plan / UC Course  I have reviewed the triage vital signs and the nursing notes.  Pertinent labs & imaging results that were available during my care of the patient were reviewed by me and considered in my medical decision making (see chart for details).    22 year old female presents with dental pain.  No evidence of abscess.  Treated with Augmentin. Tylenol as needed. See dentist. No additional pain medications given as patient is pregnant.  Final Clinical Impressions(s) / UC Diagnoses    Final diagnoses:  Pain, dental     Discharge Instructions     Antibiotic as needed.  Tylenol as needed.  Call dentist.  Take care  Dr. Adriana Simas    ED Prescriptions    Medication Sig Dispense Auth. Provider   amoxicillin-clavulanate (AUGMENTIN) 875-125 MG tablet Take 1 tablet by mouth every 12 (twelve) hours. 14 tablet Tommie Sams, DO     Controlled Substance Prescriptions [place] Controlled Substance Registry consulted? Not Applicable   Tommie Sams, DO 03/25/18 1600

## 2018-04-02 ENCOUNTER — Other Ambulatory Visit: Payer: Self-pay

## 2018-04-02 ENCOUNTER — Encounter: Payer: Self-pay | Admitting: Emergency Medicine

## 2018-04-02 ENCOUNTER — Ambulatory Visit
Admission: EM | Admit: 2018-04-02 | Discharge: 2018-04-02 | Disposition: A | Discharge status: Home / Self Care | Payer: Medicaid Other | Attending: Internal Medicine | Admitting: Internal Medicine

## 2018-04-02 DIAGNOSIS — F419 Anxiety disorder, unspecified: Secondary | ICD-10-CM | POA: Diagnosis not present

## 2018-04-02 DIAGNOSIS — Z87440 Personal history of urinary (tract) infections: Secondary | ICD-10-CM | POA: Diagnosis not present

## 2018-04-02 DIAGNOSIS — N760 Acute vaginitis: Secondary | ICD-10-CM | POA: Diagnosis present

## 2018-04-02 DIAGNOSIS — Z331 Pregnant state, incidental: Secondary | ICD-10-CM | POA: Diagnosis not present

## 2018-04-02 LAB — URINALYSIS, COMPLETE (UACMP) WITH MICROSCOPIC
BILIRUBIN URINE: NEGATIVE
Bacteria, UA: NONE SEEN
Glucose, UA: NEGATIVE mg/dL
Hgb urine dipstick: NEGATIVE
KETONES UR: NEGATIVE mg/dL
LEUKOCYTES UA: NEGATIVE
NITRITE: NEGATIVE
Protein, ur: NEGATIVE mg/dL
SPECIFIC GRAVITY, URINE: 1.015 (ref 1.005–1.030)
pH: 8 (ref 5.0–8.0)

## 2018-04-02 MED ORDER — FLUCONAZOLE 150 MG PO TABS: 150.0000 mg | ORAL_TABLET | Freq: Once | ORAL | 0 refills | Status: AC

## 2018-04-02 NOTE — ED Provider Notes (Signed)
[facility]    CSN: [id number] Arrival date & time: 04/02/18  1401     History   Chief Complaint Chief Complaint  Patient presents with  . Recurrent UTI    HPI Lori Willis is a 22 y.o. female.   She is pregnant.  She presents today with 1 day history of mild vaginal itching discomfort.  No change in discharge, not spotting, no pelvic or abdominal pain.  No change in bowel movements.  No urinary discomfort.  No fever, no malaise.  She had some antibiotics for dental infection in the last week, and is suspicious that she has a yeast infection.    HPI  Past Medical History:  Diagnosis Date  . Anxiety     Past Surgical History:  Procedure Laterality Date  . HEMORROIDECTOMY      OB History    Gravida  4   Para  2   Term      Preterm      AB  1   Living  2     SAB      TAB  1   Ectopic      Multiple      Live Births  2            Home Medications    Prior to Admission medications   Medication Sig Start Date End Date Taking? Authorizing Provider  amoxicillin-clavulanate (AUGMENTIN) 875-125 MG tablet Take 1 tablet by mouth every 12 (twelve) hours. 03/25/18  Yes Tommie Sams, DO  Prenatal Vit-Fe Fumarate-FA (PRENATAL MULTIVITAMIN) TABS tablet Take 1 tablet by mouth daily at 12 noon.   Yes [provider]  fluconazole (DIFLUCAN) 150 MG tablet Take 1 tablet (150 mg total) by mouth once for 1 dose. Repeat dose in 3d if needed. For positive yeast test. 04/02/18 04/02/18  Isa Rankin, MD    Family History Family History  Problem Relation Age of Onset  . Hypertension Mother   . Anemia Mother   . Diabetes Mother   . Hypertension Father     Social History Social History   Tobacco Use  . Smoking status: Never Smoker  . Smokeless tobacco: Never Used  Substance Use Topics  . Alcohol use: No    Frequency: Never  . Drug use: No     Allergies   Benadryl [diphenhydramine]   Review of Systems Review of Systems   All other systems reviewed and are negative.    Physical Exam Triage Vital Signs ED Triage Vitals  Enc Vitals Group     BP 04/02/18 1435 102/72     Pulse Rate 04/02/18 1435 80     Resp 04/02/18 1435 16     Temp 04/02/18 1435 98.4 F (36.9 C)     Temp Source 04/02/18 1435 Oral     SpO2 04/02/18 1435 100 %     Weight 04/02/18 1436 146 lb (66.2 kg)     Height 04/02/18 1436  (1.6 m)     Pain Score 04/02/18 1436 0     Pain Loc --    Updated Vital Signs BP 102/72 (BP Location: Left Arm)   Pulse 80   Temp 98.4 F (36.9 C) (Oral)   Resp 16   Ht  (1.6 m)   Wt 146 lb (66.2 kg)   LMP 10/07/2017 (Exact Date)   SpO2 100%   BMI 25.86 kg/m  Physical Exam  Constitutional: She is oriented to person, place, and time. No  distress.  HENT:  Head: Atraumatic.  Eyes:  Conjugate gaze observed, no eye redness/discharge  Neck: Neck supple.  Cardiovascular: Normal rate.  Pulmonary/Chest: No respiratory distress.  Abdominal: She exhibits no distension.  Gravid  Musculoskeletal: Normal range of motion.  Neurological: She is alert and oriented to person, place, and time.  Skin: Skin is warm and dry.  Nursing note and vitals reviewed.    UC Treatments / Results  Labs Results for orders placed or performed during the hospital encounter of 04/02/18  Urinalysis, Complete w Microscopic  Result Value Ref Range   Color, Urine YELLOW YELLOW   APPearance CLEAR CLEAR   Specific Gravity, Urine 1.015 1.005 - 1.030   pH 8.0 5.0 - 8.0   Glucose, UA NEGATIVE NEGATIVE mg/dL   Hgb urine dipstick NEGATIVE NEGATIVE   Bilirubin Urine NEGATIVE NEGATIVE   Ketones, ur NEGATIVE NEGATIVE mg/dL   Protein, ur NEGATIVE NEGATIVE mg/dL   Nitrite NEGATIVE NEGATIVE   Leukocytes, UA NEGATIVE NEGATIVE   Squamous Epithelial / LPF 0-5 0 - 5   WBC, UA 0-5 0 - 5 WBC/hpf   RBC / HPF 0-5 0 - 5 RBC/hpf   Bacteria, UA NONE SEEN NONE SEEN    EKG None  Radiology No results  found.  Procedures Procedures (including critical care time)  Medications Ordered in UC Medications - No data to display   Final Clinical Impressions(s) / UC Diagnoses   Final diagnoses:  Vaginitis and vulvovaginitis     Discharge Instructions     Symptoms and recent antibiotic treatment consistent with yeast infection.  Patient requests fluconazole, has taken it during previous pregnancy with relief.  Prescription for fluconazole sent to the pharmacy.  Recheck or followup with OB for further evaluation if symptoms are not improving as expected over the next couple days.   ED Prescriptions    Medication Sig Dispense Auth. Provider   fluconazole (DIFLUCAN) 150 MG tablet Take 1 tablet (150 mg total) by mouth once for 1 dose. Repeat dose in 3d if needed. For positive yeast test. 2 tablet Isa Rankin, MD       Isa Rankin, MD 04/04/18 [phone]

## 2018-04-02 NOTE — Discharge Instructions (Addendum)
Symptoms and recent antibiotic treatment consistent with yeast infection.  Prescription for fluconazole sent to the pharmacy.  Recheck or followup with OB for further evaluation if symptoms are not improving as expected over the next couple days.

## 2018-04-02 NOTE — ED Triage Notes (Signed)
Patient states she is having vaginal itching and irritation

## 2018-04-07 ENCOUNTER — Ambulatory Visit (INDEPENDENT_AMBULATORY_CARE_PROVIDER_SITE_OTHER): Payer: Medicaid Other | Admitting: Obstetrics and Gynecology

## 2018-04-07 ENCOUNTER — Encounter: Payer: Self-pay | Admitting: Obstetrics and Gynecology

## 2018-04-07 VITALS — BP 95/63 | HR 102 | Wt 152.2 lb

## 2018-04-07 DIAGNOSIS — Z113 Encounter for screening for infections with a predominantly sexual mode of transmission: Secondary | ICD-10-CM

## 2018-04-07 DIAGNOSIS — Z130 Encounter for screening for diseases of the blood and blood-forming organs and certain disorders involving the immune mechanism: Secondary | ICD-10-CM

## 2018-04-07 DIAGNOSIS — Z3482 Encounter for supervision of other normal pregnancy, second trimester: Secondary | ICD-10-CM

## 2018-04-07 DIAGNOSIS — O479 False labor, unspecified: Secondary | ICD-10-CM

## 2018-04-07 DIAGNOSIS — Z131 Encounter for screening for diabetes mellitus: Secondary | ICD-10-CM

## 2018-04-07 LAB — POCT URINALYSIS DIPSTICK
Bilirubin, UA: NEGATIVE
Glucose, UA: NEGATIVE
Ketones, UA: NEGATIVE
Leukocytes, UA: NEGATIVE
Nitrite, UA: NEGATIVE
PH UA: 7 (ref 5.0–8.0)
Protein, UA: NEGATIVE
RBC UA: NEGATIVE
Spec Grav, UA: 1.015 (ref 1.010–1.025)
UROBILINOGEN UA: 0.2 U/dL

## 2018-04-07 NOTE — Progress Notes (Signed)
ROB: Patient with occasional Braxton Hicks contractions. Otherwise doing well.  RTC in 2 weeks.  For 28 week labs at that time. States that [facility] but has not decided. Advised to let us know when her decision is made.

## 2018-04-07 NOTE — Progress Notes (Signed)
ROB-pt stated that she is having braxton hick contractions but they are not regular. Pt stated that she is doing well no concerns.

## 2018-04-21 ENCOUNTER — Encounter: Payer: Self-pay | Admitting: Obstetrics and Gynecology

## 2018-04-21 ENCOUNTER — Other Ambulatory Visit: Payer: Medicaid Other

## 2018-04-21 ENCOUNTER — Ambulatory Visit (INDEPENDENT_AMBULATORY_CARE_PROVIDER_SITE_OTHER): Payer: Medicaid Other | Admitting: Obstetrics and Gynecology

## 2018-04-21 VITALS — BP 105/66 | HR 108 | Wt 156.0 lb

## 2018-04-21 DIAGNOSIS — Z3482 Encounter for supervision of other normal pregnancy, second trimester: Secondary | ICD-10-CM

## 2018-04-21 DIAGNOSIS — Z113 Encounter for screening for infections with a predominantly sexual mode of transmission: Secondary | ICD-10-CM

## 2018-04-21 DIAGNOSIS — Z23 Encounter for immunization: Secondary | ICD-10-CM

## 2018-04-21 DIAGNOSIS — Z130 Encounter for screening for diseases of the blood and blood-forming organs and certain disorders involving the immune mechanism: Secondary | ICD-10-CM

## 2018-04-21 DIAGNOSIS — Z131 Encounter for screening for diabetes mellitus: Secondary | ICD-10-CM

## 2018-04-21 LAB — POCT URINALYSIS DIPSTICK
BILIRUBIN UA: NEGATIVE
Glucose, UA: NEGATIVE
Ketones, UA: NEGATIVE
Leukocytes, UA: NEGATIVE
Nitrite, UA: NEGATIVE
Odor: NEGATIVE
PH UA: 7 (ref 5.0–8.0)
PROTEIN UA: NEGATIVE
RBC UA: NEGATIVE
Spec Grav, UA: 1.015 (ref 1.010–1.025)
Urobilinogen, UA: 0.2 E.U./dL

## 2018-04-21 MED ORDER — TETANUS-DIPHTH-ACELL PERTUSSIS 5-2.5-18.5 LF-MCG/0.5 IM SUSP
0.5000 mL | Freq: Once | INTRAMUSCULAR | Status: AC
  Administered 2018-04-21: 0.5 mL via INTRAMUSCULAR

## 2018-04-21 MED ORDER — TETANUS-DIPHTH-ACELL PERTUSSIS 5-2.5-18.5 LF-MCG/0.5 IM SUSP: 0.5000 mL | Freq: Once | INTRAMUSCULAR | Status: DC

## 2018-04-21 NOTE — Patient Instructions (Signed)
DTaP Vaccine (Diphtheria, Tetanus, and Pertussis): What You Need to Know 1. Why get vaccinated? Diphtheria, tetanus, and pertussis are serious diseases caused by bacteria. Diphtheria and pertussis are spread from person to person. Tetanus enters the body through cuts or wounds. DIPHTHERIA causes a thick covering in the back of the throat.  It can lead to breathing problems, paralysis, heart failure, and even death.  TETANUS (Lockjaw) causes painful tightening of the muscles, usually all over the body.  It can lead to "locking" of the jaw so the victim cannot open his mouth or swallow. Tetanus leads to death in up to 2 out of 10 cases.  PERTUSSIS (Whooping Cough) causes coughing spells so bad that it is hard for infants to eat, drink, or breathe. These spells can last for weeks.  It can lead to pneumonia, seizures (jerking and staring spells), brain damage, and death.  Diphtheria, tetanus, and pertussis vaccine (DTaP) can help prevent these diseases. Most children who are vaccinated with DTaP will be protected throughout childhood. Many more children would get these diseases if we stopped vaccinating. DTaP is a safer version of an older vaccine called DTP. DTP is no longer used in the [place]. 2. Who should get DTaP vaccine and when? Children should get 5 doses of DTaP vaccine, one dose at each of the following ages:  2 months  4 months  6 months  15-18 months  4-6 years  DTaP may be given at the same time as other vaccines. 3. Some children should not get DTaP vaccine or should wait  Children with minor illnesses, such as a cold, may be vaccinated. But children who are moderately or severely ill should usually wait until they recover before getting DTaP vaccine.  Any child who had a life-threatening allergic reaction after a dose of DTaP should not get another dose.  Any child who suffered a brain or nervous system disease within 7 days after a dose of DTaP should not get  another dose.  Talk with your doctor if your child: ? had a seizure or collapsed after a dose of DTaP, ? cried non-stop for 3 hours or more after a dose of DTaP, ? had a fever over 105F after a dose of DTaP. Ask your doctor for more information. Some of these children should not get another dose of pertussis vaccine, but may get a vaccine without pertussis, called DT. 4. Older children and adults DTaP is not licensed for adolescents, adults, or children 7 years of age and older. But older people still need protection. A vaccine called Tdap is similar to DTaP. A single dose of Tdap is recommended for people 11 through 24 years of age. Another vaccine, called Td, protects against tetanus and diphtheria, but not pertussis. It is recommended every 10 years. There are separate Vaccine Information Statements for these vaccines. 5. What are the risks from DTaP vaccine? Getting diphtheria, tetanus, or pertussis disease is much riskier than getting DTaP vaccine. However, a vaccine, like any medicine, is capable of causing serious problems, such as severe allergic reactions. The risk of DTaP vaccine causing serious harm, or death, is extremely small. Mild problems (common)  Fever (up to about 1 child in 4)  Redness or swelling where the shot was given (up to about 1 child in 4)  Soreness or tenderness where the shot was given (up to about 1 child in 4) These problems occur more often after the 4th and 5th doses of the DTaP series than after   earlier doses. Sometimes the 4th or 5th dose of DTaP vaccine is followed by swelling of the entire arm or leg in which the shot was given, lasting 1-7 days (up to about 1 child in 30). Other mild problems include:  Fussiness (up to about 1 child in 3)  Tiredness or poor appetite (up to about 1 child in 10)  Vomiting (up to about 1 child in 50) These problems generally occur 1-3 days after the shot. Moderate problems (uncommon)  Seizure (jerking or staring)  (about 1 child out of 14,000)  Non-stop crying, for 3 hours or more (up to about 1 child out of 1,000)  High fever, over 105F (about 1 child out of 16,000) Severe problems (very rare)  Serious allergic reaction (less than 1 out of a million doses)  Several other severe problems have been reported after DTaP vaccine. These include: ? Long-term seizures, coma, or lowered consciousness ? Permanent brain damage. These are so rare it is hard to tell if they are caused by the vaccine. Controlling fever is especially important for children who have had seizures, for any reason. It is also important if another family member has had seizures. You can reduce fever and pain by giving your child an aspirin-free pain reliever when the shot is given, and for the next 24 hours, following the package instructions. 6. What if there is a serious reaction? What should I look for? Look for anything that concerns you, such as signs of a severe allergic reaction, very high fever, or behavior changes. Signs of a severe allergic reaction can include hives, swelling of the face and throat, difficulty breathing, a fast heartbeat, dizziness, and weakness. These would start a few minutes to a few hours after the vaccination. What should I do?  If you think it is a severe allergic reaction or other emergency that can't wait, call 9-1-1 or get the person to the nearest hospital. Otherwise, call your doctor.  Afterward, the reaction should be reported to the Vaccine Adverse Event Reporting System (VAERS). Your doctor might file this report, or you can do it yourself through the VAERS web site at www.vaers.hhs.gov, or by calling 1-800-822-7967. ? VAERS is only for reporting reactions. They do not give medical advice. 7. The National Vaccine Injury Compensation Program The National Vaccine Injury Compensation Program (VICP) is a federal program that was created to compensate people who may have been injured by certain  vaccines. Persons who believe they may have been injured by a vaccine can learn about the program and about filing a claim by calling 1-800-338-2382 or visiting the VICP website at www.hrsa.gov/vaccinecompensation. 8. How can I learn more?  Ask your doctor.  Call your local or state health department.  Contact the Centers for Disease Control and Prevention (CDC): ? Call 1-800-232-4636 (1-800-CDC-INFO) or ? Visit CDC's website at www.cdc.gov/vaccines CDC DTaP Vaccine (Diphtheria, Tetanus, and Pertussis) VIS (03/19/06) This information is not intended to replace advice given to you by your health care provider. Make sure you discuss any questions you have with your health care provider. Document Released: 08/17/2006 Document Revised: 07/10/2016 Document Reviewed: 07/10/2016 Elsevier Interactive Patient Education  2017 Elsevier Inc.  

## 2018-04-21 NOTE — Progress Notes (Signed)
ROB- gtc, tdap, btc- done.

## 2018-04-21 NOTE — Progress Notes (Signed)
ROB: Occasional Braxton Hicks.  Doing well otherwise.  1 hour GCT today.

## 2018-04-22 LAB — CBC
Hematocrit: 31.6 % — ABNORMAL LOW (ref 34.0–46.6)
Hemoglobin: 10.3 g/dL — ABNORMAL LOW (ref 11.1–15.9)
MCH: 27.9 pg (ref 26.6–33.0)
MCHC: 32.6 g/dL (ref 31.5–35.7)
MCV: 86 fL (ref 79–97)
Platelets: 222 10*3/uL (ref 150–450)
RBC: 3.69 x10E6/uL — AB (ref 3.77–5.28)
RDW: 14.6 % (ref 12.3–15.4)
WBC: 10.1 10*3/uL (ref 3.4–10.8)

## 2018-04-22 LAB — GLUCOSE, 1 HOUR GESTATIONAL: Gestational Diabetes Screen: 89 mg/dL (ref 65–139)

## 2018-04-22 LAB — RPR: RPR Ser Ql: NONREACTIVE

## 2018-05-05 ENCOUNTER — Encounter: Payer: Medicaid Other | Admitting: Obstetrics and Gynecology

## 2018-05-11 ENCOUNTER — Encounter: Payer: Medicaid Other | Admitting: Obstetrics and Gynecology

## 2018-05-14 ENCOUNTER — Observation Stay
Admission: EM | Admit: 2018-05-14 | Discharge: 2018-05-14 | Disposition: A | Discharge status: Home / Self Care | Payer: Medicaid Other | Attending: Obstetrics and Gynecology | Admitting: Obstetrics and Gynecology

## 2018-05-14 ENCOUNTER — Encounter: Payer: Self-pay | Admitting: *Deleted

## 2018-05-14 DIAGNOSIS — R109 Unspecified abdominal pain: Secondary | ICD-10-CM | POA: Diagnosis not present

## 2018-05-14 DIAGNOSIS — R197 Diarrhea, unspecified: Secondary | ICD-10-CM | POA: Diagnosis not present

## 2018-05-14 DIAGNOSIS — Z3A31 31 weeks gestation of pregnancy: Secondary | ICD-10-CM | POA: Insufficient documentation

## 2018-05-14 DIAGNOSIS — O9989 Other specified diseases and conditions complicating pregnancy, childbirth and the puerperium: Principal | ICD-10-CM | POA: Insufficient documentation

## 2018-05-14 LAB — URINALYSIS, COMPLETE (UACMP) WITH MICROSCOPIC
BILIRUBIN URINE: NEGATIVE
Bacteria, UA: NONE SEEN
Glucose, UA: >=500 mg/dL — AB
Hgb urine dipstick: NEGATIVE
KETONES UR: NEGATIVE mg/dL
Leukocytes, UA: NEGATIVE
Nitrite: NEGATIVE
PROTEIN: NEGATIVE mg/dL
SPECIFIC GRAVITY, URINE: 1.008 (ref 1.005–1.030)
pH: 6 (ref 5.0–8.0)

## 2018-05-14 NOTE — OB Triage Note (Signed)
Presents with complaint of discomfort in the  left mid abdominal area and then in the lower left side as well. Pt states it is frequent but not constant. Denies any urinary symptoms but states she had some diarrhea yesterday and today.  Denies nausea and vomiting.

## 2018-05-17 ENCOUNTER — Telehealth: Payer: Self-pay | Admitting: Obstetrics and Gynecology

## 2018-05-17 ENCOUNTER — Encounter: Payer: Self-pay | Admitting: Obstetrics and Gynecology

## 2018-05-17 ENCOUNTER — Ambulatory Visit (INDEPENDENT_AMBULATORY_CARE_PROVIDER_SITE_OTHER): Payer: Medicaid Other | Admitting: Obstetrics and Gynecology

## 2018-05-17 VITALS — BP 104/67 | HR 117 | Wt 159.0 lb

## 2018-05-17 DIAGNOSIS — M419 Scoliosis, unspecified: Secondary | ICD-10-CM

## 2018-05-17 DIAGNOSIS — Z3483 Encounter for supervision of other normal pregnancy, third trimester: Secondary | ICD-10-CM

## 2018-05-17 DIAGNOSIS — Z3A32 32 weeks gestation of pregnancy: Secondary | ICD-10-CM

## 2018-05-17 DIAGNOSIS — O99891 Other specified diseases and conditions complicating pregnancy: Secondary | ICD-10-CM

## 2018-05-17 DIAGNOSIS — R0689 Other abnormalities of breathing: Secondary | ICD-10-CM

## 2018-05-17 DIAGNOSIS — M549 Dorsalgia, unspecified: Secondary | ICD-10-CM

## 2018-05-17 DIAGNOSIS — O99013 Anemia complicating pregnancy, third trimester: Secondary | ICD-10-CM

## 2018-05-17 DIAGNOSIS — O9989 Other specified diseases and conditions complicating pregnancy, childbirth and the puerperium: Secondary | ICD-10-CM

## 2018-05-17 LAB — POCT URINALYSIS DIPSTICK
Bilirubin, UA: NEGATIVE
Glucose, UA: NEGATIVE
Ketones, UA: NEGATIVE
LEUKOCYTES UA: NEGATIVE
Nitrite, UA: NEGATIVE
PH UA: 7 (ref 5.0–8.0)
Protein, UA: POSITIVE — AB
RBC UA: NEGATIVE
SPEC GRAV UA: 1.015 (ref 1.010–1.025)
UROBILINOGEN UA: 0.2 U/dL

## 2018-05-17 MED ORDER — NEBULIZER DEVI: 1.0000 | 0 refills | Status: DC | PRN

## 2018-05-17 MED ORDER — IRON SUCCINYL-PROTEIN COMPLEX 40 MG/15ML PO SOLN: 15.0000 mL | Freq: Every day | ORAL | 1 refills | Status: DC

## 2018-05-17 MED ORDER — ALBUTEROL SULFATE (2.5 MG/3ML) 0.083% IN NEBU: 2.5000 mg | INHALATION_SOLUTION | Freq: Four times a day (QID) | RESPIRATORY_TRACT | 12 refills | Status: DC | PRN

## 2018-05-17 MED ORDER — ALBUTEROL SULFATE HFA 108 (90 BASE) MCG/ACT IN AERS: 2.0000 | INHALATION_SPRAY | Freq: Four times a day (QID) | RESPIRATORY_TRACT | 2 refills | Status: DC | PRN

## 2018-05-17 NOTE — Telephone Encounter (Signed)
The patient called and stated that she would like to Speak with Dr. Valentino Saxonherry or her nurse in regards to her Nebulizer. No other information was disclosed. Please advise.

## 2018-05-17 NOTE — Telephone Encounter (Signed)
Patient is requesting asthma supply prescription to be sent to [facility] in [place] so Medicaid will cover the cost.  Please advise, thanks.

## 2018-05-17 NOTE — Progress Notes (Signed)
ROB: Patient complains of abdominal, hip, and side pain. Has used Tylenol, warm compresses, warm baths.  Unable to use pregnancy girdles due to bad scoliosis (however does note she was able to receive epidurals during her labor). Referred to physical therapy   Letter provided for work restrictions/driving limitations. Mild anemia noted, prescribed iron liquid form as patient has had difficulty tolerating pills in the past. Has also been trying to increase with diet intake. Also notes issues breathing (but unsure if it is due to anxiety, fetal positioning, or possible prior h/o asthma-like symptoms (although never officially diagnosed)). Desires to see if breathing machine would help. Will prescribe Albuterol inhaler and nebulizer. Advised to inform if no improvement to assess if further workup needed).  Normal glucola. Desires to breastfeed. Unsure of contraceptive method (but notes she is considering becoming a surrogate after this pregnancy).  RTC in 2 weeks.

## 2018-05-17 NOTE — Progress Notes (Signed)
ROB-pt stated that she is having some side, abd, hip and pelvic pain. Pt stated no other complaints.

## 2018-05-17 NOTE — Discharge Summary (Signed)
    L&D OB Triage Note  SUBJECTIVE Lori FarrierCheyenne Willis is a 22 y.o. [phone] female at 7520w5d, EDD Estimated Date of Delivery: 07/14/18 who presented to triage with complaints of left-sided abdominal pain and some diarrhea.   OB History  Gravida Para Term Preterm AB Living  4 2 2  0 1 2  SAB TAB Ectopic Multiple Live Births  0 1 0 0 2    # Outcome Date GA Lbr Len/2nd Weight Sex Delivery Anes PTL Lv  4 Current           3 Term 2017 4361w6d  6 lb 14 oz (3.118 kg) F Vag-Spont  Y LIV  2 Term 2015 8070w0d  7 lb 15 oz (3.6 kg) M Vag-Spont  N LIV  1 TAB 2013 2427w0d       LIV    No medications prior to admission.     OBJECTIVE  Nursing Evaluation:   Temp 97.9 F (36.6 C) (Oral)   Resp 16   LMP 10/07/2017 (Exact Date)    Findings:   Rare contractions   NST was performed and has been reviewed by me.  NST INTERPRETATION: Category I  Mode: External Baseline Rate (A): 135 bpm Variability: Moderate Accelerations: 15 x 15 Decelerations: None     Contraction Frequency (min): none  ASSESSMENT Impression:  1.  Pregnancy:  [id number] at 7720w5d , EDD Estimated Date of Delivery: 07/14/18 2.  NST:  Category I  PLAN 1. Reassurance given 2. Discharge home with standard labor precautions given to return to L&D or call the office for problems. 3. Continue routine prenatal care.

## 2018-05-18 ENCOUNTER — Telehealth: Payer: Self-pay | Admitting: Obstetrics and Gynecology

## 2018-05-18 NOTE — Telephone Encounter (Signed)
Please see another encounter.

## 2018-05-18 NOTE — Addendum Note (Signed)
Addended by: Silvano BilisHAMPTON, Isami Mehra L on: 05/18/2018 04:38 PM   Modules accepted: Orders

## 2018-05-18 NOTE — Telephone Encounter (Signed)
Ok, this is fine.  We can send it there.   Dr. Valentino Saxonherry

## 2018-05-18 NOTE — Telephone Encounter (Signed)
Patient called to check on the status of her nebulizer being sent to a different pharmacy. Thanks

## 2018-05-18 NOTE — Telephone Encounter (Signed)
Pt was called and explained that her prescription nebulizer was going to be sent to [facility].

## 2018-05-20 ENCOUNTER — Ambulatory Visit: Payer: Medicaid Other | Admitting: Physical Therapy

## 2018-05-21 ENCOUNTER — Other Ambulatory Visit: Payer: Self-pay

## 2018-05-21 MED ORDER — NEBULIZER DEVI: 1.0000 | 0 refills | Status: DC | PRN

## 2018-05-21 NOTE — Telephone Encounter (Signed)
Please see another encounter.

## 2018-05-21 NOTE — Telephone Encounter (Signed)
Pregnancy Home Care Manager in office contacted the pt and set up a pharmacy that she could get her medication from.

## 2018-05-31 ENCOUNTER — Ambulatory Visit (INDEPENDENT_AMBULATORY_CARE_PROVIDER_SITE_OTHER): Payer: Medicaid Other | Admitting: Obstetrics and Gynecology

## 2018-05-31 VITALS — BP 106/68 | HR 111 | Wt 161.1 lb

## 2018-05-31 DIAGNOSIS — O99013 Anemia complicating pregnancy, third trimester: Secondary | ICD-10-CM

## 2018-05-31 DIAGNOSIS — Z8709 Personal history of other diseases of the respiratory system: Secondary | ICD-10-CM

## 2018-05-31 DIAGNOSIS — D649 Anemia, unspecified: Secondary | ICD-10-CM

## 2018-05-31 DIAGNOSIS — Z3483 Encounter for supervision of other normal pregnancy, third trimester: Secondary | ICD-10-CM

## 2018-05-31 LAB — POCT URINALYSIS DIPSTICK
Bilirubin, UA: NEGATIVE
Glucose, UA: NEGATIVE
KETONES UA: NEGATIVE
LEUKOCYTES UA: NEGATIVE
NITRITE UA: NEGATIVE
PH UA: 6.5 (ref 5.0–8.0)
PROTEIN UA: NEGATIVE
RBC UA: NEGATIVE
UROBILINOGEN UA: 0.2 U/dL

## 2018-05-31 NOTE — Progress Notes (Signed)
ROB-pt stated that she is doing well no complaints.  

## 2018-05-31 NOTE — Patient Instructions (Signed)
Iron-Rich Diet Iron is a mineral that helps your body to produce hemoglobin. Hemoglobin is a protein in your red blood cells that carries oxygen to your body's tissues. Eating too little iron may cause you to feel weak and tired, and it can increase your risk for infection. Eating enough iron is necessary for your body's metabolism, muscle function, and nervous system. Iron is naturally found in many foods. It can also be added to foods or fortified in foods. There are two types of dietary iron:  Heme iron. Heme iron is absorbed by the body more easily than nonheme iron. Heme iron is found in meat, poultry, and fish.  Nonheme iron. Nonheme iron is found in dietary supplements, iron-fortified grains, beans, and vegetables.  You may need to follow an iron-rich diet if:  You have been diagnosed with iron deficiency or iron-deficiency anemia.  You have a condition that prevents you from absorbing dietary iron, such as: ? Infection in your intestines. ? Celiac disease. This involves long-lasting (chronic) inflammation of your intestines.  You do not eat enough iron.  You eat a diet that is high in foods that impair iron absorption.  You have lost a lot of blood.  You have heavy bleeding during your menstrual cycle.  You are pregnant.  What is my plan? Your health care provider may help you to determine how much iron you need per day based on your condition. Generally, when a person consumes sufficient amounts of iron in the diet, the following iron needs are met:  Men. ? 14-18 years old: 11 mg per day. ? 19-50 years old: 8 mg per day.  Women. ? 14-18 years old: 15 mg per day. ? 19-50 years old: 18 mg per day. ? Over 50 years old: 8 mg per day. ? Pregnant women: 27 mg per day. ? Breastfeeding women: 9 mg per day.  What do I need to know about an iron-rich diet?  Eat fresh fruits and vegetables that are high in vitamin C along with foods that are high in iron. This will help  increase the amount of iron that your body absorbs from food, especially with foods containing nonheme iron. Foods that are high in vitamin C include oranges, peppers, tomatoes, and mango.  Take iron supplements only as directed by your health care provider. Overdose of iron can be life-threatening. If you were prescribed iron supplements, take them with orange juice or a vitamin C supplement.  Cook foods in pots and pans that are made from iron.  Eat nonheme iron-containing foods alongside foods that are high in heme iron. This helps to improve your iron absorption.  Certain foods and drinks contain compounds that impair iron absorption. Avoid eating these foods in the same meal as iron-rich foods or with iron supplements. These include: ? Coffee, black tea, and red wine. ? Milk, dairy products, and foods that are high in calcium. ? Beans, soybeans, and peas. ? Whole grains.  When eating foods that contain both nonheme iron and compounds that impair iron absorption, follow these tips to absorb iron better. ? Soak beans overnight before cooking. ? Soak whole grains overnight and drain them before using. ? Ferment flours before baking, such as using yeast in bread dough. What foods can I eat? Grains Iron-fortified breakfast cereal. Iron-fortified whole-wheat bread. Enriched rice. Sprouted grains. Vegetables Spinach. Potatoes with skin. Green peas. Broccoli. Red and green bell peppers. Fermented vegetables. Fruits Prunes. Raisins. Oranges. Strawberries. Mango. Grapefruit. Meats and Other Protein Sources   Beef liver. Oysters. Beef. Shrimp. [place]. Chicken. [place]. Sardines. Chickpeas. Nuts. Tofu. Beverages Tomato juice. Fresh orange juice. Prune juice. Hibiscus tea. Fortified instant breakfast shakes. Condiments Tahini. Fermented soy sauce. Sweets and Desserts Black-strap molasses. Other Wheat germ. The items listed above may not be a complete list of recommended foods or beverages.  Contact your dietitian for more options. What foods are not recommended? Grains Whole grains. Bran cereal. Bran flour. Oats. Vegetables Artichokes. Brussels sprouts. Kale. Fruits Blueberries. Raspberries. Strawberries. Figs. Meats and Other Protein Sources Soybeans. Products made from soy protein. Dairy Milk. Cream. Cheese. Yogurt. Cottage cheese. Beverages Coffee. Black tea. Red wine. Sweets and Desserts Cocoa. Chocolate. Ice cream. Other Basil. Oregano. Parsley. The items listed above may not be a complete list of foods and beverages to avoid. Contact your dietitian for more information. This information is not intended to replace advice given to you by your health care provider. Make sure you discuss any questions you have with your health care provider. Document Released: 06/03/2005 Document Revised: 05/09/2016 Document Reviewed: 05/17/2014 Elsevier Interactive Patient Education  [employer].

## 2018-05-31 NOTE — Progress Notes (Signed)
ROB: Patient notes that the inhaler has helped her breathing.  Has not had an opportunity to use the nebulizer as much as she just got it. Has not been able to get liquid iron as insurance will not cover it.  Advised on iron rich foods. Patient to RTC in 2 weeks.

## 2018-06-03 ENCOUNTER — Other Ambulatory Visit: Payer: Self-pay

## 2018-06-03 ENCOUNTER — Ambulatory Visit: Payer: Medicaid Other | Attending: Obstetrics and Gynecology | Admitting: Physical Therapy

## 2018-06-03 ENCOUNTER — Encounter: Payer: Self-pay | Admitting: Physical Therapy

## 2018-06-03 DIAGNOSIS — M791 Myalgia, unspecified site: Secondary | ICD-10-CM

## 2018-06-03 DIAGNOSIS — M533 Sacrococcygeal disorders, not elsewhere classified: Secondary | ICD-10-CM | POA: Diagnosis not present

## 2018-06-03 DIAGNOSIS — M4125 Other idiopathic scoliosis, thoracolumbar region: Secondary | ICD-10-CM | POA: Diagnosis present

## 2018-06-03 NOTE — Patient Instructions (Signed)
Seated rainbow twist ( high five the sky with rotation at midback ) 10 reps Boths    Stretching the R lumbar with hand sliding on the wall, R leg back  10 reps    Wear SIJ belt all day   Wear shoe lift in L shoe , wear closed shoes with  sole support . Avoid wearing flip flops     Soft knees in stance,
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
## 2018-06-08 NOTE — Addendum Note (Signed)
Addended by: Mariane MastersYEUNG, SHIN-YIING on: 06/08/2018 09:50 AM   Modules accepted: Orders

## 2018-06-09 ENCOUNTER — Ambulatory Visit: Payer: Medicaid Other | Admitting: Physical Therapy

## 2018-06-09 DIAGNOSIS — M4125 Other idiopathic scoliosis, thoracolumbar region: Secondary | ICD-10-CM

## 2018-06-09 DIAGNOSIS — M533 Sacrococcygeal disorders, not elsewhere classified: Secondary | ICD-10-CM | POA: Diagnosis not present

## 2018-06-09 DIAGNOSIS — M791 Myalgia, unspecified site: Secondary | ICD-10-CM

## 2018-06-09 NOTE — Patient Instructions (Addendum)
Three -fours x day    Wall lean for lower back scoliosis yellow band   Left Forearm slide against wall as you stand perpendicular to wall, band under feet, feet hip width apart,  Opposite elbow by side, , imagine holding pencil under armpit as you lean toward wall, lowering forearm against the wall and opposite hand pulls band without letting elbow move away from side body  And slide back up , straightening body    Make sure the upper trapezius muscle does not hike up to ear as band is being pulled as you lean towards wall    band  5 x    Leaning _ only to open the _ flank area   _______   Seated stretch for upper curve:  Chair facing door,  "hugging ball towards R with the L hand, shoulder blades down" while L hand pressing against the doorknob   10 x     ________  Seated:  Strengthening mid back      band on other side of the door knob    Squeeze shoulder blades together first,  Inhale, exhale, pull bands past your pocket  Press into feet   10 x    _________  Decreasing neck tensions  Press hand into chair by hips , squeezing of shoulders down and back  Chin tucks  j-scoop back not down  10 reps

## 2018-06-10 NOTE — Therapy (Signed)
[facility] [address] [place], [place], [address] Phone: [phone]   Fax:  [phone]  Physical Therapy Treatment  Patient Details  Name: Lori Willis MRN: [id number] Date of Birth: 1996-04-20 Referring Provider: Hildred LaserAnika Cherry, MD    Encounter Date: 06/09/2018  PT End of Session - 06/09/18 1154    Visit Number  2    Number of Visits  12    Authorization Type  Medicaid, re cert at visit 4    PT Start Time  1107    PT Stop Time  1154    PT Time Calculation (min)  47 min    Activity Tolerance  Patient tolerated treatment well;No increased pain    Behavior During Therapy  WFL for tasks assessed/performed       Past Medical History:  Diagnosis Date  . Anxiety     Past Surgical History:  Procedure Laterality Date  . HEMORROIDECTOMY      There were no vitals filed for this visit.  Subjective Assessment - 06/09/18 1112    Subjective  Pt started having more pain in L LBP prior to her 4 day migraine. Pt has minor pain today in L LBP 3/10.  Pt has found her SIJ belt to be helpful when shopping.     Patient Stated Goals  to cope with daily activities          [facility] Assessment - 06/10/18 1733      Observation/Other Assessments   Observations  anterior tilt of pelvis , hyperextension of knees in stance      Palpation   Spinal mobility  thoracic spine interspinals, paraspinal , L posterior intercostal,  medial scapular mm                    [facility] Adult PT Treatment/Exercise - 06/10/18 1732      Moist Heat Therapy   Moist Heat Location  Hip   R      Manual Therapy   Manual therapy comments  STM along thoracic spine interspinals, paraspinal , L posterior intercostal,  medial scapular mm              PT Education - 06/09/18 1154    Education Details  HEP    Person(s) Educated  Patient    Methods  Explanation;Demonstration;Tactile cues;Handout;Verbal cues    Comprehension  Returned  demonstration;Verbalized understanding          PT Long Term Goals - 06/04/18 1913      PT LONG TERM GOAL #1   Title  Pt will decrease he ODI score from 17 % to <12 % in order to return to ADLs and care for her children    Baseline  17%    Time  12    Period  Weeks    Status  New    Target Date  08/27/18      PT LONG TERM GOAL #2   Title  Pt will demo restored mobility at B SIJ without tenderness upon palpation at PSIS and lateral border of sacrum across 2 visits in order to progress to strengthening exercises    Baseline  tenderness upon palpation, tensions noted along gluts    Time  4    Period  Weeks    Status  New    Target Date  07/02/18      PT LONG TERM GOAL #3   Title  Pt will demo no pelvic obliquities across  2 visits in order to sit and stand for longer periods    Baseline  L ASIS higher than R    Time  2    Period  Weeks    Status  New    Target Date  06/18/18      PT LONG TERM GOAL #4   Title  Pt will be IND with scoliosis specific HEP in order to minimize worsening of spinal deviations 2/2 to scoliosis     Baseline  poor understanding of corrective exercises     Time  8    Period  Weeks    Status  New    Target Date  07/30/18            Plan - 06/10/18 1734    Clinical Impression Statement  Pt demo'd less mm tensions along thoracic spine interspinals, paraspinal , L posterior intercostal,  and medial scapular mm post Tx. Pt tolerated Tx without increased pain. Pt demo'd improved propioception of pelvic tilt to minimize increased lumbar lordosis and hyperextension of knees in standing posture. Added today thoracolumbar strengthening with resistance bands and cervicothoracic strengthening. Pt continues to benefit from skilled PT.     Rehab Potential  Good    PT Frequency  1x / week    PT Duration  12 weeks    PT Treatment/Interventions  Therapeutic activities;Therapeutic exercise;Patient/family education;Gait training;Moist Heat;Stair  training;Neuromuscular re-education;Balance training;Scar mobilization;Taping;Manual techniques;Functional mobility training    Consulted and Agree with Plan of Care  Patient       Patient will benefit from skilled therapeutic intervention in order to improve the following deficits and impairments:  Abnormal gait, Improper body mechanics, Pain, Increased muscle spasms, Decreased scar mobility, Decreased mobility, Decreased coordination, Hypomobility, Decreased endurance, Decreased activity tolerance, Decreased strength, Postural dysfunction, Decreased balance, Decreased safety awareness  Visit Diagnosis: Sacrococcygeal disorders, not elsewhere classified  Other idiopathic scoliosis, thoracolumbar region  Myalgia     Problem List Patient Active Problem List   Diagnosis Date Noted  . Indication for care in labor or delivery 05/14/2018    Mariane Masters ,PT, DPT, E-RYT  06/10/2018, 5:37 PM  [facility] MAIN [facility] SERVICES [address] [place], [place], [address] Phone: [phone]   Fax:  [phone]  Name: Lori Willis MRN: [id number] Date of Birth: 1996-09-09

## 2018-06-15 ENCOUNTER — Encounter: Payer: Self-pay | Admitting: Obstetrics and Gynecology

## 2018-06-15 ENCOUNTER — Ambulatory Visit (INDEPENDENT_AMBULATORY_CARE_PROVIDER_SITE_OTHER): Payer: Medicaid Other | Admitting: Obstetrics and Gynecology

## 2018-06-15 VITALS — BP 90/62 | HR 103 | Wt 162.0 lb

## 2018-06-15 DIAGNOSIS — Z3483 Encounter for supervision of other normal pregnancy, third trimester: Secondary | ICD-10-CM

## 2018-06-15 LAB — POCT URINALYSIS DIPSTICK
BILIRUBIN UA: NEGATIVE
Blood, UA: NEGATIVE
Glucose, UA: NEGATIVE
KETONES UA: NEGATIVE
Leukocytes, UA: NEGATIVE
PH UA: 7.5 (ref 5.0–8.0)
Protein, UA: NEGATIVE
UROBILINOGEN UA: 0.2 U/dL

## 2018-06-15 NOTE — Progress Notes (Signed)
Pt states she is having headaches everyday, ongoing for the past several months becoming more frequent farther along in pregnancy.
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
## 2018-06-16 ENCOUNTER — Ambulatory Visit: Payer: Medicaid Other | Admitting: Physical Therapy

## 2018-06-16 DIAGNOSIS — M4125 Other idiopathic scoliosis, thoracolumbar region: Secondary | ICD-10-CM

## 2018-06-16 DIAGNOSIS — M533 Sacrococcygeal disorders, not elsewhere classified: Secondary | ICD-10-CM | POA: Diagnosis not present

## 2018-06-16 DIAGNOSIS — M791 Myalgia, unspecified site: Secondary | ICD-10-CM

## 2018-06-16 NOTE — Therapy (Signed)
[place] [facility] [address] [place], [place], [address] Phone: [phone]   Fax:  [phone]  Physical Therapy Treatment  Patient Details  Name: Lori Willis MRN: [id number] Date of Birth: 1996-05-13 Referring Provider: Hildred LaserAnika Cherry, MD    Encounter Date: 06/16/2018  PT End of Session - 06/16/18 1155    Visit Number  3    Number of Visits  12    Authorization Type  Medicaid, re cert at visit 4    PT Start Time  1106    PT Stop Time  1155    PT Time Calculation (min)  49 min    Activity Tolerance  Patient tolerated treatment well;No increased pain    Behavior During Therapy  WFL for tasks assessed/performed       Past Medical History:  Diagnosis Date  . Anxiety     Past Surgical History:  Procedure Laterality Date  . HEMORROIDECTOMY      There were no vitals filed for this visit.  Subjective Assessment - 06/16/18 1113    Subjective  Pt reports she had pain relief after last session which lasted longer for a whole day instead of half a day compared to previous session. Pt's pain is 5/10 in the L lower back. hips, and neck.     Patient Stated Goals  to cope with daily activities          [facility] - 06/16/18 1339      Palpation   Palpation comment  tightness upper thoracic L and R interspinals , medial scapular B, R intercostals posterior                   [facility] Adult PT Treatment/Exercise - 06/16/18 1148      Exercises   Exercises  --   see pt instructions     Moist Heat Therapy   Moist Heat Location  --   shoulder B     Manual Therapy   Manual therapy comments  STM along upper thoracic L and R interspinals , medial scapular B, R intercostals posterior                  PT Long Term Goals - 06/04/18 1913      PT LONG TERM GOAL #1   Title  Pt will decrease he ODI score from 17 % to <12 % in order to return to ADLs and care for her children    Baseline  17%     Time  12    Period  Weeks    Status  New    Target Date  08/27/18      PT LONG TERM GOAL #2   Title  Pt will demo restored mobility at B SIJ without tenderness upon palpation at PSIS and lateral border of sacrum across 2 visits in order to progress to strengthening exercises    Baseline  tenderness upon palpation, tensions noted along gluts    Time  4    Period  Weeks    Status  New    Target Date  07/02/18      PT LONG TERM GOAL #3   Title  Pt will demo no pelvic obliquities across 2 visits in order to sit and stand for longer periods    Baseline  L ASIS higher than R    Time  2    Period  Weeks    Status  New  Target Date  06/18/18      PT LONG TERM GOAL #4   Title  Pt will be IND with scoliosis specific HEP in order to minimize worsening of spinal deviations 2/2 to scoliosis     Baseline  poor understanding of corrective exercises     Time  8    Period  Weeks    Status  New    Target Date  07/30/18            Plan - 06/16/18 1342    Clinical Impression Statement  Pt showed decreased back mm tightness compared to last session. Addressed thoracic curve mm tensions bilaterally with manual Tx and provided one-sided stretches to address scoliotic curve at this region. Pt continues to show good carry over with less anterior tilt of pelvis. Pt demo'd proper technique with new HEP. Progressed with more cervical/ thoracolumbar strengthening to minimize forward head posture. Pt continues to benefit from skilled PT.     Rehab Potential  Good    PT Frequency  1x / week    PT Duration  12 weeks    PT Treatment/Interventions  Therapeutic activities;Therapeutic exercise;Patient/family education;Gait training;Moist Heat;Stair training;Neuromuscular re-education;Balance training;Scar mobilization;Taping;Manual techniques;Functional mobility training    Consulted and Agree with Plan of Care  Patient       Patient will benefit from skilled therapeutic intervention in order to  improve the following deficits and impairments:  Abnormal gait, Improper body mechanics, Pain, Increased muscle spasms, Decreased scar mobility, Decreased mobility, Decreased coordination, Hypomobility, Decreased endurance, Decreased activity tolerance, Decreased strength, Postural dysfunction, Decreased balance, Decreased safety awareness  Visit Diagnosis: Sacrococcygeal disorders, not elsewhere classified  Other idiopathic scoliosis, thoracolumbar region  Myalgia     Problem List Patient Active Problem List   Diagnosis Date Noted  . Indication for care in labor or delivery 05/14/2018    Mariane MastersYeung,Shin Yiing ,PT, DPT, E-RYT  06/16/2018, 1:45 PM  [place] [facility] [address] [place], [place], [address] Phone: [phone]   Fax:  [phone]  Name: Lori Willis MRN: [id number] Date of Birth: [birth year]

## 2018-06-16 NOTE — Patient Instructions (Signed)
Upper curve stretches and strengthening   Through the day    Seated:  Hugging a ball motion to the R to loosen L shoulder blade 10  x    Sidelying on L side    Stretching R armpit area , reaching R hand over head  10x    Seated with hands by hips, pressing down on chair, feet on ground Squeeze shoulders down and back, chin back 5 sec count aloud 10 x every 2 hours

## 2018-06-22 ENCOUNTER — Ambulatory Visit (INDEPENDENT_AMBULATORY_CARE_PROVIDER_SITE_OTHER): Payer: Medicaid Other | Admitting: Obstetrics and Gynecology

## 2018-06-22 VITALS — BP 105/75 | HR 125 | Wt 167.1 lb

## 2018-06-22 DIAGNOSIS — Z3483 Encounter for supervision of other normal pregnancy, third trimester: Secondary | ICD-10-CM

## 2018-06-22 DIAGNOSIS — Z62819 Personal history of unspecified abuse in childhood: Secondary | ICD-10-CM

## 2018-06-22 DIAGNOSIS — Z8659 Personal history of other mental and behavioral disorders: Secondary | ICD-10-CM

## 2018-06-22 DIAGNOSIS — Z3A36 36 weeks gestation of pregnancy: Secondary | ICD-10-CM

## 2018-06-22 LAB — POCT URINALYSIS DIPSTICK OB
BILIRUBIN UA: NEGATIVE
Blood, UA: NEGATIVE
GLUCOSE, UA: NEGATIVE — AB
KETONES UA: NEGATIVE
Leukocytes, UA: NEGATIVE
NITRITE UA: NEGATIVE
SPEC GRAV UA: 1.01 (ref 1.010–1.025)
Urobilinogen, UA: 0.2 E.U./dL
pH, UA: 7.5 (ref 5.0–8.0)

## 2018-06-22 NOTE — Progress Notes (Signed)
ROB- PT stated that she is doing well.Pt stated that she is having some contractions and pressure in the lower pelvic area. No other complaints.
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
## 2018-06-23 ENCOUNTER — Ambulatory Visit: Payer: Medicaid Other | Admitting: Physical Therapy

## 2018-06-23 VITALS — BP 94/61

## 2018-06-23 DIAGNOSIS — M791 Myalgia, unspecified site: Secondary | ICD-10-CM

## 2018-06-23 DIAGNOSIS — M533 Sacrococcygeal disorders, not elsewhere classified: Secondary | ICD-10-CM

## 2018-06-23 DIAGNOSIS — M4125 Other idiopathic scoliosis, thoracolumbar region: Secondary | ICD-10-CM

## 2018-06-23 NOTE — Patient Instructions (Addendum)
breathing technique for pushing during labor  Exhale to use the stomach muscles to press downward into the pelvic floor  Instead of pushing outward with stomach    Set up breastfeeding station with another chair/ bounce chair, baby secured and safe in it,  Sit to stand without baby first and then mini squat to lift baby  When seated, feet are flat on stool or floor    Keep up with scoliosis exercises and band strengthening    Standing at 45 deg in ski tracks when at changing table        [email or web address] ( mrptny.com/ )                          DECREASE DOWNWARD PRESSURE ON  YOUR PELVIC FLOOR, ABDOMINAL, LOW BACK MUSCLES       PRESERVE YOUR PELVIC HEALTH LONG-TERM   ** SQUEEZE pelvic floor BEFORE YOUR SNEEZE, COUGH, LAUGH   ** EXHALE BEFORE YOU RISE AGAINST GRAVITY (lifting, sit to stand, from squat to stand)   ** LOG ROLL OUT OF BED INSTEAD OF CRUNCH/SIT-UP

## 2018-06-24 ENCOUNTER — Observation Stay
Admission: EM | Admit: 2018-06-24 | Discharge: 2018-06-24 | Disposition: A | Discharge status: Home / Self Care | Payer: Medicaid Other | Attending: Obstetrics and Gynecology | Admitting: Obstetrics and Gynecology

## 2018-06-24 ENCOUNTER — Other Ambulatory Visit: Payer: Self-pay

## 2018-06-24 DIAGNOSIS — R1030 Lower abdominal pain, unspecified: Secondary | ICD-10-CM | POA: Insufficient documentation

## 2018-06-24 DIAGNOSIS — O9989 Other specified diseases and conditions complicating pregnancy, childbirth and the puerperium: Secondary | ICD-10-CM

## 2018-06-24 DIAGNOSIS — R109 Unspecified abdominal pain: Secondary | ICD-10-CM

## 2018-06-24 DIAGNOSIS — O26893 Other specified pregnancy related conditions, third trimester: Principal | ICD-10-CM | POA: Insufficient documentation

## 2018-06-24 DIAGNOSIS — Z3A37 37 weeks gestation of pregnancy: Secondary | ICD-10-CM

## 2018-06-24 LAB — STREP GP B NAA: Strep Gp B NAA: NEGATIVE

## 2018-06-24 LAB — GC/CHLAMYDIA PROBE AMP
CHLAMYDIA, DNA PROBE: NEGATIVE
NEISSERIA GONORRHOEAE BY PCR: NEGATIVE
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
## 2018-06-24 NOTE — Final Progress Note (Signed)
L&D OB Triage Note  Lori FarrierCheyenne Willis is a 22 y.o. [phone] female at 591w1d, EDD Estimated Date of Delivery: 07/14/18 who presented to triage for complaints of sharp lower abdominal pain x 1 day, 7/10 on pain scale, beginning at 2 pm. Notes taking Tylenol prior to arrival at the hospital with minimal relief. She was evaluated by the nurses with no significant findings for contractions or labor. Vital signs stable. An NST was performed and has been reviewed by MD. She was offered treatment for pain however declined.  Stated that she was just worried she was going into labor and was alone with her 2 small children.    NST INTERPRETATION: Indications: rule out uterine contractions  Mode: External Baseline Rate (A): 140 bpm Variability: Moderate Accelerations: 15 x 15 Decelerations: None     Contraction Frequency (min): None  Impression: reactive   Urinalysis Routine Prenatal on 06/22/2018  Component Date Value Ref Range Status  . Color, UA 06/22/2018 yellow   Final  . Clarity, UA 06/22/2018 clear   Final  . Glucose, UA 06/22/2018 Negative* (none) Final  . Bilirubin, UA 06/22/2018 neg   Final  . Ketones, UA 06/22/2018 neg   Final  . Spec Grav, UA 06/22/2018 1.010  1.010 - 1.025 Final  . Blood, UA 06/22/2018 neg   Final  . pH, UA 06/22/2018 7.5  5.0 - 8.0 Final  . POC Protein UA 06/22/2018 Trace  Negative, Trace Final  . Urobilinogen, UA 06/22/2018 0.2  0.2 or 1.0 E.U./dL Final  . Nitrite, UA 81/19/147808/20/2019 neg   Final  . Leukocytes, UA 06/22/2018 Negative  Negative Final  . Appearance 06/22/2018 yellow   Final        Plan: NST performed was reviewed and was found to be reactive. Recent UA was negative performed 2 days ago in clinic). She was discharged home with bleeding/labor precautions.  Continue routine prenatal care. Continue Tylenol for pain as needed. Follow up with OB/GYN as previously scheduled.     Hildred LaserAnika Chanon Loney, MD  [facility]

## 2018-06-24 NOTE — OB Triage Note (Signed)
Patient offered pain medication per MD.  Patient declines need for pain medication or other intervention at this time.

## 2018-06-24 NOTE — OB Triage Note (Signed)
Patient arrived on unit from ED via wheelchair with complaints of multiple sharp pains in abdomen starting about one hour ago.  Patient rates her pain 7/10.  Patient states she is unsure of whether there are contractions.  No complaints of vaginal bleeding or leaking fluid.  Patient reports positive fetal movement.  Patient oriented to room.  Monitors applied and assessing.

## 2018-06-29 ENCOUNTER — Ambulatory Visit (INDEPENDENT_AMBULATORY_CARE_PROVIDER_SITE_OTHER): Payer: Medicaid Other | Admitting: Obstetrics and Gynecology

## 2018-06-29 VITALS — BP 107/70 | HR 130 | Wt 167.9 lb

## 2018-06-29 DIAGNOSIS — Z3483 Encounter for supervision of other normal pregnancy, third trimester: Secondary | ICD-10-CM

## 2018-06-29 DIAGNOSIS — R102 Pelvic and perineal pain: Secondary | ICD-10-CM

## 2018-06-29 DIAGNOSIS — O9989 Other specified diseases and conditions complicating pregnancy, childbirth and the puerperium: Secondary | ICD-10-CM

## 2018-06-29 LAB — POCT URINALYSIS DIPSTICK OB
Bilirubin, UA: NEGATIVE
Blood, UA: NEGATIVE
Ketones, UA: NEGATIVE
Leukocytes, UA: NEGATIVE
Nitrite, UA: NEGATIVE
POC,PROTEIN,UA: NEGATIVE
Spec Grav, UA: 1.01 (ref 1.010–1.025)
Urobilinogen, UA: 0.2 E.U./dL
pH, UA: 7 (ref 5.0–8.0)

## 2018-06-29 NOTE — Progress Notes (Signed)
ROB: Complains of pelvic discomfort, discussed warm baths, exercise ball.  Discussed labor precautions. Glucose in urine today, patient notes she has been craving sweets lately.  Tachycardia noted on today's exam again. However, notes that she used her albuterol inhaler recently today.

## 2018-06-29 NOTE — Progress Notes (Signed)
ROB- PT stated that she is that she having pain in her hip and pelvic area. No complaints.

## 2018-07-06 ENCOUNTER — Ambulatory Visit (INDEPENDENT_AMBULATORY_CARE_PROVIDER_SITE_OTHER): Payer: Medicaid Other | Admitting: Obstetrics and Gynecology

## 2018-07-06 ENCOUNTER — Encounter: Payer: Medicaid Other | Admitting: Obstetrics and Gynecology

## 2018-07-06 VITALS — BP 104/69 | HR 120 | Wt 166.9 lb

## 2018-07-06 DIAGNOSIS — Z3483 Encounter for supervision of other normal pregnancy, third trimester: Secondary | ICD-10-CM

## 2018-07-06 DIAGNOSIS — R102 Pelvic and perineal pain unspecified side: Secondary | ICD-10-CM

## 2018-07-06 DIAGNOSIS — O26899 Other specified pregnancy related conditions, unspecified trimester: Secondary | ICD-10-CM

## 2018-07-06 DIAGNOSIS — Z23 Encounter for immunization: Secondary | ICD-10-CM

## 2018-07-06 LAB — POCT URINALYSIS DIPSTICK OB
BILIRUBIN UA: NEGATIVE
Blood, UA: NEGATIVE
Glucose, UA: NEGATIVE
KETONES UA: NEGATIVE
Leukocytes, UA: NEGATIVE
NITRITE UA: NEGATIVE
PH UA: 7 (ref 5.0–8.0)
PROTEIN: NEGATIVE
Spec Grav, UA: 1.015 (ref 1.010–1.025)
UROBILINOGEN UA: 0.2 U/dL

## 2018-07-06 NOTE — Progress Notes (Signed)
ROB- PT stated that she is doing well other than having back and pelvic pain. No other complaints.

## 2018-07-06 NOTE — Progress Notes (Addendum)
ROB: Still noting low back and pelvic pain but knows this is normal. Reiterated labor precautions. Declines flu vaccine. RTC in 1 week.

## 2018-07-09 NOTE — Progress Notes (Signed)
ROB- PT stated that she is having a lot of pelvic and lower back pain. No other complaints.

## 2018-07-13 ENCOUNTER — Ambulatory Visit (INDEPENDENT_AMBULATORY_CARE_PROVIDER_SITE_OTHER): Payer: Medicaid Other | Admitting: Obstetrics and Gynecology

## 2018-07-13 VITALS — BP 101/64 | HR 94 | Wt 170.1 lb

## 2018-07-13 DIAGNOSIS — Z3483 Encounter for supervision of other normal pregnancy, third trimester: Secondary | ICD-10-CM

## 2018-07-13 DIAGNOSIS — O480 Post-term pregnancy: Secondary | ICD-10-CM

## 2018-07-13 LAB — POCT URINALYSIS DIPSTICK OB
BILIRUBIN UA: NEGATIVE
GLUCOSE, UA: NEGATIVE
Ketones, UA: NEGATIVE
Leukocytes, UA: NEGATIVE
Nitrite, UA: NEGATIVE
POC,PROTEIN,UA: NEGATIVE
RBC UA: NEGATIVE
Urobilinogen, UA: 0.2 E.U./dL
pH, UA: 7 (ref 5.0–8.0)

## 2018-07-13 NOTE — Progress Notes (Signed)
ROB: Patient reports she thinks she lost her mucus plug several days ago.  Having more pelvic pressure and hip pain. Reiterated labor precautions. Discussed IOL for post-dates pregnancy.  Will schedule for 07/19/2018 (patient requests this date). RTC on Friday for ultrasound for post-dates.

## 2018-07-13 NOTE — Patient Instructions (Signed)
Labor Induction Labor induction is when steps are taken to cause a pregnant woman to begin the labor process. Most women go into labor on their own between 37 weeks and 42 weeks of the pregnancy. When this does not happen or when there is a medical need, methods may be used to induce labor. Labor induction causes a pregnant woman's uterus to contract. It also causes the cervix to soften (ripen), open (dilate), and thin out (efface). Usually, labor is not induced before 39 weeks of the pregnancy unless there is a problem with the baby or mother. Before inducing labor, your health care provider will consider a number of factors, including the following:  The medical condition of you and the baby.  How many weeks along you are.  The status of the baby's lung maturity.  The condition of the cervix.  The position of the baby. What are the reasons for labor induction? Labor may be induced for the following reasons:  The health of the baby or mother is at risk.  The pregnancy is overdue by 1 week or more.  The water breaks but labor does not start on its own.  The mother has a health condition or serious illness, such as high blood pressure, infection, placental abruption, or diabetes.  The amniotic fluid amounts are low around the baby.  The baby is distressed. Convenience or wanting the baby to be born on a certain date is not a reason for inducing labor. What methods are used for labor induction? Several methods of labor induction may be used, such as:  Prostaglandin medicine. This medicine causes the cervix to dilate and ripen. The medicine will also start contractions. It can be taken by mouth or by inserting a suppository into the vagina.  Inserting a thin tube (catheter) with a balloon on the end into the vagina to dilate the cervix. Once inserted, the balloon is expanded with water, which causes the cervix to open.  Stripping the membranes. Your health care provider separates  amniotic sac tissue from the cervix, causing the cervix to be stretched and causing the release of a hormone called progesterone. This may cause the uterus to contract. It is often done during an office visit. You will be sent home to wait for the contractions to begin. You will then come in for an induction.  Breaking the water. Your health care provider makes a hole in the amniotic sac using a small instrument. Once the amniotic sac breaks, contractions should begin. This may still take hours to see an effect.  Medicine to trigger or strengthen contractions. This medicine is given through an IV access tube inserted into a vein in your arm. All of the methods of induction, besides stripping the membranes, will be done in the hospital. Induction is done in the hospital so that you and the baby can be carefully monitored. How long does it take for labor to be induced? Some inductions can take up to 2-3 days. Depending on the cervix, it usually takes less time. It takes longer when you are induced early in the pregnancy or if this is your first pregnancy. If a mother is still pregnant and the induction has been going on for 2-3 days, either the mother will be sent home or a cesarean delivery will be needed. What are the risks associated with labor induction? Some of the risks of induction include:  Changes in fetal heart rate, such as too high, too low, or erratic.  Fetal distress.    Chance of infection for the mother and baby.  Increased chance of having a cesarean delivery.  Breaking off (abruption) of the placenta from the uterus (rare).  Uterine rupture (very rare). When induction is needed for medical reasons, the benefits of induction may outweigh the risks. What are some reasons for not inducing labor? Labor induction should not be done if:  It is shown that your baby does not tolerate labor.  You have had previous surgeries on your uterus, such as a myomectomy or the removal of  fibroids.  Your placenta lies very low in the uterus and blocks the opening of the cervix (placenta previa).  Your baby is not in a head-down position.  The umbilical cord drops down into the birth canal in front of the baby. This could cut off the baby's blood and oxygen supply.  You have had a previous cesarean delivery.  There are unusual circumstances, such as the baby being extremely premature. This information is not intended to replace advice given to you by your health care provider. Make sure you discuss any questions you have with your health care provider. Document Released: 03/11/2007 Document Revised: 03/27/2016 Document Reviewed: 05/19/2013 Elsevier Interactive Patient Education  2017 [employer]  

## 2018-07-16 ENCOUNTER — Ambulatory Visit (INDEPENDENT_AMBULATORY_CARE_PROVIDER_SITE_OTHER): Payer: Medicaid Other

## 2018-07-16 DIAGNOSIS — Z3483 Encounter for supervision of other normal pregnancy, third trimester: Secondary | ICD-10-CM | POA: Diagnosis not present

## 2018-07-16 DIAGNOSIS — Z3A38 38 weeks gestation of pregnancy: Secondary | ICD-10-CM | POA: Diagnosis not present

## 2018-07-16 DIAGNOSIS — O480 Post-term pregnancy: Secondary | ICD-10-CM

## 2018-07-17 ENCOUNTER — Other Ambulatory Visit: Payer: Self-pay

## 2018-07-17 ENCOUNTER — Inpatient Hospital Stay: Payer: Medicaid Other | Admitting: Anesthesiology

## 2018-07-17 ENCOUNTER — Inpatient Hospital Stay
Admission: EM | Admit: 2018-07-17 | Discharge: 2018-07-18 | DRG: 807 | Disposition: A | Discharge status: Home / Self Care | Payer: Medicaid Other | Attending: Obstetrics and Gynecology | Admitting: Obstetrics and Gynecology

## 2018-07-17 DIAGNOSIS — Z3A40 40 weeks gestation of pregnancy: Secondary | ICD-10-CM

## 2018-07-17 DIAGNOSIS — D649 Anemia, unspecified: Secondary | ICD-10-CM | POA: Diagnosis present

## 2018-07-17 DIAGNOSIS — O480 Post-term pregnancy: Secondary | ICD-10-CM | POA: Diagnosis present

## 2018-07-17 DIAGNOSIS — O9902 Anemia complicating childbirth: Secondary | ICD-10-CM | POA: Diagnosis present

## 2018-07-17 DIAGNOSIS — O479 False labor, unspecified: Secondary | ICD-10-CM | POA: Diagnosis present

## 2018-07-17 LAB — CBC
HCT: 29.6 % — ABNORMAL LOW (ref 35.0–47.0)
HEMOGLOBIN: 9.5 g/dL — AB (ref 12.0–16.0)
MCH: 23.5 pg — ABNORMAL LOW (ref 26.0–34.0)
MCHC: 32 g/dL (ref 32.0–36.0)
MCV: 73.5 fL — ABNORMAL LOW (ref 80.0–100.0)
Platelets: 239 10*3/uL (ref 150–440)
RBC: 4.03 MIL/uL (ref 3.80–5.20)
RDW: 18.8 % — ABNORMAL HIGH (ref 11.5–14.5)
WBC: 17.3 10*3/uL — AB (ref 3.6–11.0)

## 2018-07-17 LAB — TYPE AND SCREEN
ABO/RH(D): B POS
ANTIBODY SCREEN: NEGATIVE

## 2018-07-17 MED ORDER — ACETAMINOPHEN 325 MG PO TABS
650.0000 mg | ORAL_TABLET | ORAL | Status: DC | PRN
  Administered 2018-07-18 (×2): 650 mg via ORAL
  Filled 2018-07-17 (×2): qty 2

## 2018-07-17 MED ORDER — PRENATAL MULTIVITAMIN CH
1.0000 | ORAL_TABLET | Freq: Every day | ORAL | Status: DC
  Administered 2018-07-17 – 2018-07-18 (×2): 1 via ORAL
  Filled 2018-07-17 (×2): qty 1

## 2018-07-17 MED ORDER — IBUPROFEN 600 MG PO TABS
600.0000 mg | ORAL_TABLET | Freq: Four times a day (QID) | ORAL | Status: DC
  Administered 2018-07-17 – 2018-07-18 (×5): 600 mg via ORAL
  Filled 2018-07-17 (×5): qty 1

## 2018-07-17 MED ORDER — OXYCODONE-ACETAMINOPHEN 5-325 MG PO TABS: 1.0000 | ORAL_TABLET | ORAL | Status: DC | PRN

## 2018-07-17 MED ORDER — DIPHENHYDRAMINE HCL 25 MG PO CAPS: 25.0000 mg | ORAL_CAPSULE | Freq: Four times a day (QID) | ORAL | Status: DC | PRN

## 2018-07-17 MED ORDER — LIDOCAINE HCL (PF) 1 % IJ SOLN: 30.0000 mL | INTRAMUSCULAR | Status: DC | PRN

## 2018-07-17 MED ORDER — EPHEDRINE 5 MG/ML INJ: 10.0000 mg | INTRAVENOUS | Status: DC | PRN

## 2018-07-17 MED ORDER — BENZOCAINE-MENTHOL 20-0.5 % EX AERO: 1.0000 "application " | INHALATION_SPRAY | CUTANEOUS | Status: DC | PRN

## 2018-07-17 MED ORDER — EPHEDRINE 5 MG/ML INJ
INTRAVENOUS | Status: AC
  Administered 2018-07-17: 20 mg via INTRAVENOUS
  Filled 2018-07-17: qty 4

## 2018-07-17 MED ORDER — MISOPROSTOL 200 MCG PO TABS
ORAL_TABLET | ORAL | Status: AC
  Filled 2018-07-17: qty 4

## 2018-07-17 MED ORDER — LACTATED RINGERS IV SOLN: 500.0000 mL | INTRAVENOUS | Status: DC | PRN

## 2018-07-17 MED ORDER — LACTATED RINGERS IV SOLN
INTRAVENOUS | Status: DC
  Administered 2018-07-17 (×2): via INTRAVENOUS

## 2018-07-17 MED ORDER — FENTANYL 2.5 MCG/ML W/ROPIVACAINE 0.15% IN NS 100 ML EPIDURAL (ARMC)
12.0000 mL/h | EPIDURAL | Status: DC
  Administered 2018-07-17: 12 mL/h via EPIDURAL

## 2018-07-17 MED ORDER — BUPIVACAINE HCL (PF) 0.25 % IJ SOLN
INTRAMUSCULAR | Status: DC | PRN
  Administered 2018-07-17: 10 mL via EPIDURAL

## 2018-07-17 MED ORDER — ONDANSETRON HCL 4 MG/2ML IJ SOLN
4.0000 mg | Freq: Four times a day (QID) | INTRAMUSCULAR | Status: DC | PRN
  Administered 2018-07-17: 4 mg via INTRAVENOUS
  Filled 2018-07-17: qty 2

## 2018-07-17 MED ORDER — COCONUT OIL OIL: 1.0000 "application " | TOPICAL_OIL | Status: DC | PRN

## 2018-07-17 MED ORDER — LIDOCAINE HCL (PF) 1 % IJ SOLN
INTRAMUSCULAR | Status: DC | PRN
  Administered 2018-07-17: 3 mL

## 2018-07-17 MED ORDER — LACTATED RINGERS IV SOLN: 500.0000 mL | Freq: Once | INTRAVENOUS | Status: DC

## 2018-07-17 MED ORDER — AMMONIA AROMATIC IN INHA
RESPIRATORY_TRACT | Status: AC
  Filled 2018-07-17: qty 10

## 2018-07-17 MED ORDER — PHENYLEPHRINE 40 MCG/ML (10ML) SYRINGE FOR IV PUSH (FOR BLOOD PRESSURE SUPPORT): 80.0000 ug | PREFILLED_SYRINGE | INTRAVENOUS | Status: DC | PRN

## 2018-07-17 MED ORDER — EPHEDRINE 5 MG/ML INJ
INTRAVENOUS | Status: AC
  Filled 2018-07-17: qty 4

## 2018-07-17 MED ORDER — OXYTOCIN 10 UNIT/ML IJ SOLN
INTRAMUSCULAR | Status: AC
  Filled 2018-07-17: qty 2

## 2018-07-17 MED ORDER — CALCIUM CARBONATE ANTACID 500 MG PO CHEW
1.0000 | CHEWABLE_TABLET | ORAL | Status: DC | PRN
  Administered 2018-07-17: 200 mg via ORAL
  Filled 2018-07-17: qty 1

## 2018-07-17 MED ORDER — OXYTOCIN 40 UNITS IN LACTATED RINGERS INFUSION - SIMPLE MED
2.5000 [IU]/h | INTRAVENOUS | Status: DC
  Administered 2018-07-17: 2.5 [IU]/h via INTRAVENOUS

## 2018-07-17 MED ORDER — OXYTOCIN 40 UNITS IN LACTATED RINGERS INFUSION - SIMPLE MED
INTRAVENOUS | Status: AC
  Administered 2018-07-17: 500 mL via INTRAVENOUS
  Filled 2018-07-17: qty 1000

## 2018-07-17 MED ORDER — BUTORPHANOL TARTRATE 2 MG/ML IJ SOLN: 1.0000 mg | INTRAMUSCULAR | Status: DC | PRN

## 2018-07-17 MED ORDER — LIDOCAINE-EPINEPHRINE (PF) 1.5 %-1:200000 IJ SOLN
INTRAMUSCULAR | Status: DC | PRN
  Administered 2018-07-17: 3 mL via PERINEURAL

## 2018-07-17 MED ORDER — DIBUCAINE 1 % RE OINT: 1.0000 "application " | TOPICAL_OINTMENT | RECTAL | Status: DC | PRN

## 2018-07-17 MED ORDER — LIDOCAINE HCL (PF) 1 % IJ SOLN
INTRAMUSCULAR | Status: AC
  Filled 2018-07-17: qty 30

## 2018-07-17 MED ORDER — TERBUTALINE SULFATE 1 MG/ML IJ SOLN: 0.2500 mg | Freq: Once | INTRAMUSCULAR | Status: DC | PRN

## 2018-07-17 MED ORDER — SENNOSIDES-DOCUSATE SODIUM 8.6-50 MG PO TABS
2.0000 | ORAL_TABLET | ORAL | Status: DC
  Administered 2018-07-18: 2 via ORAL
  Filled 2018-07-17: qty 2

## 2018-07-17 MED ORDER — SIMETHICONE 80 MG PO CHEW: 80.0000 mg | CHEWABLE_TABLET | ORAL | Status: DC | PRN

## 2018-07-17 MED ORDER — ZOLPIDEM TARTRATE 5 MG PO TABS: 5.0000 mg | ORAL_TABLET | Freq: Every evening | ORAL | Status: DC | PRN

## 2018-07-17 MED ORDER — ONDANSETRON HCL 4 MG/2ML IJ SOLN: 4.0000 mg | INTRAMUSCULAR | Status: DC | PRN

## 2018-07-17 MED ORDER — OXYCODONE-ACETAMINOPHEN 5-325 MG PO TABS: 2.0000 | ORAL_TABLET | ORAL | Status: DC | PRN

## 2018-07-17 MED ORDER — OXYTOCIN BOLUS FROM INFUSION
500.0000 mL | Freq: Once | INTRAVENOUS | Status: AC
  Administered 2018-07-17: 500 mL via INTRAVENOUS

## 2018-07-17 MED ORDER — ACETAMINOPHEN 325 MG PO TABS: 650.0000 mg | ORAL_TABLET | ORAL | Status: DC | PRN

## 2018-07-17 MED ORDER — HYDROMORPHONE HCL 2 MG PO TABS: 2.0000 mg | ORAL_TABLET | ORAL | Status: DC | PRN

## 2018-07-17 MED ORDER — SOD CITRATE-CITRIC ACID 500-334 MG/5ML PO SOLN: 30.0000 mL | ORAL | Status: DC | PRN

## 2018-07-17 MED ORDER — WITCH HAZEL-GLYCERIN EX PADS: 1.0000 "application " | MEDICATED_PAD | CUTANEOUS | Status: DC | PRN

## 2018-07-17 MED ORDER — MISOPROSTOL 50MCG HALF TABLET: 50.0000 ug | ORAL_TABLET | ORAL | Status: DC | PRN

## 2018-07-17 MED ORDER — EPHEDRINE 5 MG/ML INJ
20.0000 mg | Freq: Once | INTRAVENOUS | Status: AC
  Administered 2018-07-17: 20 mg via INTRAVENOUS

## 2018-07-17 MED ORDER — FENTANYL 2.5 MCG/ML W/ROPIVACAINE 0.15% IN NS 100 ML EPIDURAL (ARMC)
EPIDURAL | Status: AC
  Filled 2018-07-17: qty 100

## 2018-07-17 MED ORDER — ONDANSETRON HCL 4 MG PO TABS: 4.0000 mg | ORAL_TABLET | ORAL | Status: DC | PRN
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
## 2018-07-17 NOTE — OB Triage Note (Signed)
Pt is a G4P2 at 6741w3d with c/o abdominal pain and ctx. Pt states abdomina pain began about 0400 and are occurring every 5 mins. Positive fetal movement. Pt denies LOF and vaginal bleeding. Monitors applied and assessing. Initial FHT 145.

## 2018-07-17 NOTE — Anesthesia Preprocedure Evaluation (Addendum)
Anesthesia Evaluation  Patient identified by MRN, date of birth, ID band Patient awake    Reviewed: Allergy & Precautions, NPO status , Patient's Chart, lab work & pertinent test results  Airway Mallampati: II  TM Distance: >3 FB     Dental  (+) Teeth Intact   Pulmonary neg pulmonary ROS, asthma ,    Pulmonary exam normal        Cardiovascular negative cardio ROS Normal cardiovascular exam     Neuro/Psych Anxiety    GI/Hepatic negative GI ROS, Neg liver ROS,   Endo/Other  negative endocrine ROS  Renal/GU negative Renal ROS  negative genitourinary   Musculoskeletal Scoliosis per patient with a leftward angulation.   Abdominal Normal abdominal exam  (+)   Peds negative pediatric ROS (+)  Hematology negative hematology ROS (+)   Anesthesia Other Findings Past Medical History: No date: Anxiety  Scoliosis per patient with a leftward angulation.  Reproductive/Obstetrics                            Anesthesia Physical Anesthesia Plan  ASA: II  Anesthesia Plan: Epidural   Post-op Pain Management:    Induction:   PONV Risk Score and Plan:   Airway Management Planned: Natural Airway  Additional Equipment:   Intra-op Plan:   Post-operative Plan:   Informed Consent: I have reviewed the patients History and Physical, chart, labs and discussed the procedure including the risks, benefits and alternatives for the proposed anesthesia with the patient or authorized representative who has indicated his/her understanding and acceptance.   Dental advisory given  Plan Discussed with: CRNA and Surgeon  Anesthesia Plan Comments:         Anesthesia Quick Evaluation

## 2018-07-17 NOTE — Lactation Note (Signed)
This note was copied from a baby's chart. Lactation Consultation Note  Patient Name: Lori Lenoria FarrierCheyenne Grigg ZOXWR'UToday's Date: 07/17/2018 Reason for consult: Initial assessment;Term;Mother's request Mom concerned because newborn has not breast fed in over 3 1/2 hours.  Mom agrees that first breast feed back in [facility] was really good feeding.  He is really sleepy.  Waking techniques demonstrated.  Void and meconium stool changed. Can easily hand express colostrum.  Few drops hand expressed and spoon fed.  After he was spoon fed, he started sticking out his tongue as if he wanted more.  We put him to right breast in football hold.  He finally latched for a few sucks and then fell back to sleep.  We continued to re latch and massage breast for a few weak sucks for several more times.  Encouraged mom to leave him at the breast while he was latched well and let him intermittently take a few more sucks if he would.  Reviewed normal course of lactation, supply and demand and routine newborn feeding pattern.  Lactation number on white board.  Maternal Data Formula Feeding for Exclusion: No Has patient been taught Hand Expression?: Yes Does the patient have breastfeeding experience prior to this delivery?: Yes  Feeding Feeding Type: Breast Fed Length of feed: (Took a few sucks & hand expressed & spoonfed)  LATCH Score Latch: Repeated attempts needed to sustain latch, nipple held in mouth throughout feeding, stimulation needed to elicit sucking reflex.  Audible Swallowing: A few with stimulation  Type of Nipple: Everted at rest and after stimulation  Comfort (Breast/Nipple): Filling, red/small blisters or bruises, mild/mod discomfort  Hold (Positioning): Assistance needed to correctly position infant at breast and maintain latch.  LATCH Score: 6  Interventions Interventions: Breast feeding basics reviewed;Reverse pressure;Expressed milk;Assisted with latch;Breast compression;Adjust position;Breast  massage;Support pillows;Hand express;Position options  Lactation Tools Discussed/Used Tools: Other (comment)(Hand expressed and spoon fed a few drops of colostrum) WIC Program: Yes   Consult Status Consult Status: Follow-up Follow-up type: Call as needed    Louis MeckelWilliams, Orenthal Debski Kay 07/17/2018, 8:12 PM

## 2018-07-17 NOTE — Plan of Care (Signed)
  Problem: Education: Goal: Knowledge of condition will improve Outcome: Progressing   Problem: Activity: Goal: Will verbalize the importance of balancing activity with adequate rest periods Outcome: Progressing Goal: Ability to tolerate increased activity will improve Outcome: Progressing   Problem: Life Cycle: Goal: Chance of risk for complications during the postpartum period will decrease Outcome: Progressing   Problem: Skin Integrity: Goal: Demonstration of wound healing without infection will improve Outcome: Progressing   

## 2018-07-17 NOTE — Anesthesia Procedure Notes (Signed)
Epidural Patient location during procedure: OB Start time: 07/17/2018 10:55 AM End time: 07/17/2018 11:08 AM  Staffing Anesthesiologist: Yves Dillarroll, Regana Kemple, MD Performed: anesthesiologist   Preanesthetic Checklist Completed: patient identified, site marked, surgical consent, pre-op evaluation, timeout performed, IV checked, risks and benefits discussed and monitors and equipment checked  Epidural Patient position: sitting Prep: Betadine Patient monitoring: heart rate, continuous pulse ox and blood pressure Approach: midline Location: L3-L4 Injection technique: LOR air  Needle:  Needle type: Tuohy  Needle gauge: 17 G Needle length: 9 cm and 9 Needle insertion depth: 6 cm Catheter type: closed end flexible Catheter size: 19 Gauge Test dose: negative and 1.5% lidocaine with Epi 1:200 K  Assessment Sensory level: T8 Events: blood not aspirated, injection not painful, no injection resistance, negative IV test and no paresthesia  Additional Notes Time out called.  Patient placed in sitting position.  Back examined and patient stated that she has scoliosis with a curve to the left.  The back was prepped and draped in sterile fashiom.  A skin wheal was made in the L3-L4 interspace with 1% Lidocaine plain.  A 17G Tuohy needle was advanced with initial angulation to left encountering bone, but straight advancement with easy line to the epidural space with loss of resistance technique.  The epidural catheter threaded easily with no blood, fluid or paresthesias.  The epidural TD was negative and the catheter was threaded 3cm into the epidural space.  The catheter was affixed to the back in sterile fashion.  The patient tolerated the procedure well.Reason for block:procedure for pain

## 2018-07-18 LAB — CBC
HCT: 25.9 % — ABNORMAL LOW (ref 35.0–47.0)
HEMOGLOBIN: 8.3 g/dL — AB (ref 12.0–16.0)
MCH: 23.9 pg — ABNORMAL LOW (ref 26.0–34.0)
MCHC: 32.2 g/dL (ref 32.0–36.0)
MCV: 74.2 fL — ABNORMAL LOW (ref 80.0–100.0)
PLATELETS: 188 10*3/uL (ref 150–440)
RBC: 3.49 MIL/uL — ABNORMAL LOW (ref 3.80–5.20)
RDW: 18.4 % — ABNORMAL HIGH (ref 11.5–14.5)
WBC: 16.1 10*3/uL — ABNORMAL HIGH (ref 3.6–11.0)

## 2018-07-18 MED ORDER — DOCUSATE SODIUM 100 MG PO CAPS: 100.0000 mg | ORAL_CAPSULE | Freq: Every day | ORAL | 2 refills | Status: DC | PRN

## 2018-07-18 MED ORDER — FERROUS SULFATE 220 (44 FE) MG/5ML PO LIQD: 5.0000 mL | Freq: Two times a day (BID) | ORAL | 1 refills | Status: DC

## 2018-07-18 MED ORDER — IBUPROFEN 800 MG PO TABS: 800.0000 mg | ORAL_TABLET | Freq: Three times a day (TID) | ORAL | 1 refills | Status: DC | PRN

## 2018-07-18 NOTE — Progress Notes (Signed)
Patient is doing well and bonding well with her infant. Discharge instructions were reviewed and answered questions. Patient verbalized understanding.

## 2018-07-18 NOTE — Lactation Note (Signed)
This note was copied from a baby's chart. Lactation Consultation Note  Patient Name: Lori Lenoria FarrierCheyenne Penrose ZOXWR'UToday's Date: 07/18/2018  Mom concerned about baby not waking up for feedings and not opening mouth wide enough for deep latch.  Demonstrated how to wake by changing void and stool.  Demonstrated tickling of lips and waiting on wide open mouth.  Once he was good and awake, he opened wide with good flanged lips and latched with minimal assistance and began strong rhythmic sucking with swallows heard.     Maternal Data    Feeding Feeding Type: Breast Fed Length of feed: (Few sucks)  LATCH Score                   Interventions    Lactation Tools Discussed/Used     Consult Status      Louis MeckelWilliams, Shannara Winbush Kay 07/18/2018, 5:18 PM

## 2018-07-18 NOTE — Discharge Summary (Signed)
OB Discharge Summary     Patient Name: Lori FarrierCheyenne Haynesworth DOB: Sep 03, 1996 MRN: [id number]  Date of admission: 07/17/2018 Delivering MD: Hildred LaserHERRY, Vadim Centola   Date of discharge: 07/18/2018  Admitting diagnosis: 40 wks preg contractions Intrauterine pregnancy: 2835w3d     Secondary diagnosis:  Active Problems:   Irregular uterine contractions   Post-dates pregnancy   Anemia of pregnancy  Additional problems: History of PTSD     Discharge diagnosis: Term Pregnancy Delivered and Anemia                                                                                                Post partum procedures:None  Augmentation: AROM  Complications: None  Hospital course:  Onset of Labor With Vaginal Delivery     22 y.o. yo [phone] at 7835w3d was admitted in Latent Labor on 07/17/2018. Patient had an uncomplicated labor course as follows:  Membrane Rupture Time/Date: 2:28 PM ,07/17/2018   Intrapartum Procedures: Episiotomy: None [1]                                         Lacerations:  None [1]  Patient had a delivery of a Viable infant. 07/17/2018  Information for the patient's newborn:  Rometta Emeryhornton, Spencer Forrest Jr. [[id number]][[id number]]  Delivery Method: Vag-Spont    Pateint had an uncomplicated postpartum course.  She is ambulating, tolerating a regular diet, passing flatus, and urinating well. Patient is discharged home in stable condition on 07/20/18.   Physical exam  Vitals:   07/17/18 1938 07/17/18 2327 07/18/18 0317 07/18/18 0826  BP: 109/62 108/64 106/69 104/68  Pulse: (!) 102 94 88 84  Resp: 20 18 20 18   Temp: 98.1 F (36.7 C) 98.4 F (36.9 C) 98.1 F (36.7 C) 98 F (36.7 C)  TempSrc: Oral Oral Oral Oral  SpO2: 100% 99% 100% 100%  Weight:      Height:       General: alert, cooperative and no distress Lochia: appropriate Uterine Fundus: firm Incision: N/A DVT Evaluation: No evidence of DVT seen on physical exam. Negative Homan's sign. No cords or calf tenderness. No significant  calf/ankle edema.   Labs: Lab Results  Component Value Date   WBC 16.1 (H) 07/18/2018   HGB 8.3 (L) 07/18/2018   HCT 25.9 (L) 07/18/2018   MCV 74.2 (L) 07/18/2018   PLT 188 07/18/2018   CMP Latest Ref Rng & Units 12/18/2017  Glucose 65 - 99 mg/dL 82  BUN 6 - 20 mg/dL 9  Creatinine 1.300.44 - 8.651.00 mg/dL 7.840.46  Sodium 696135 - 295145 mmol/L 136  Potassium 3.5 - 5.1 mmol/L 3.3(L)  Chloride 101 - 111 mmol/L 103  CO2 22 - 32 mmol/L 22  Calcium 8.9 - 10.3 mg/dL 9.7  Total Protein 6.5 - 8.1 g/dL -  Total Bilirubin 0.3 - 1.2 mg/dL -  Alkaline Phos 38 - 284126 U/L -  AST 15 - 41 U/L -  ALT 14 - 54 U/L -    Discharge instruction: per After Visit  Summary and "Baby and Me Booklet".  After visit meds:  Allergies as of 07/18/2018      Reactions   Benadryl [diphenhydramine] Anaphylaxis      Medication List    STOP taking these medications   acetaminophen 500 MG tablet Commonly known as:  TYLENOL   albuterol (2.5 MG/3ML) 0.083% nebulizer solution Commonly known as:  PROVENTIL   albuterol 108 (90 Base) MCG/ACT inhaler Commonly known as:  PROVENTIL HFA;VENTOLIN HFA   amoxicillin 500 MG capsule Commonly known as:  AMOXIL   bisacodyl 5 MG EC tablet Commonly known as:  DULCOLAX   Nebulizer Devi     TAKE these medications   docusate sodium 100 MG capsule Commonly known as:  COLACE Take 1 capsule (100 mg total) by mouth daily as needed.   Ferrous Sulfate 220 (44 Fe) MG/5ML Liqd Take 5 mLs by mouth 2 (two) times daily.   ibuprofen 800 MG tablet Commonly known as:  ADVIL,MOTRIN Take 1 tablet (800 mg total) by mouth every 8 (eight) hours as needed.   prenatal multivitamin Tabs tablet Take 1 tablet by mouth daily at 12 noon.       Diet: routine diet  Activity: Advance as tolerated. Pelvic rest for 6 weeks.   Outpatient follow up:2 weeks Follow up Appt:No future appointments. Follow up Visit:No follow-ups on file.  Postpartum contraception: Undecided  Newborn Data: Live born  female  Birth Weight: 6 lb 11.2 oz (3040 g) APGAR: 8, 9  Newborn Delivery   Birth date/time:  07/17/2018 15:35:00 Delivery type:  Vaginal, Spontaneous     Baby Feeding: Breast Disposition:home with mother   07/18/2018 Hildred Laser, MD  [facility]

## 2018-07-18 NOTE — Discharge Instructions (Signed)
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
## 2018-07-18 NOTE — Anesthesia Postprocedure Evaluation (Signed)
Anesthesia Post Note  Patient: Lenoria FarrierCheyenne Unangst  Procedure(s) Performed: AN AD HOC LABOR EPIDURAL  Patient location during evaluation: Mother Baby Anesthesia Type: Epidural Level of consciousness: awake and alert and oriented Pain management: pain level controlled Vital Signs Assessment: post-procedure vital signs reviewed and stable Respiratory status: spontaneous breathing Cardiovascular status: blood pressure returned to baseline Anesthetic complications: no Comments: No complaints except for some mild mid and lower back discomfort understandable with long labor.     Last Vitals:  Vitals:   07/18/18 0826 07/18/18 1602  BP: 104/68 108/72  Pulse: 84 89  Resp: 18 20  Temp: 36.7 C 36.7 C  SpO2: 100%     Last Pain:  Vitals:   07/18/18 1700  TempSrc:   PainSc: 3                  Yaiden Yang

## 2018-07-18 NOTE — Progress Notes (Addendum)
Post Partum Day # 1, s/p SVD  Subjective: no complaints, up ad lib, voiding and tolerating PO  Objective: Temp:  [97.6 F (36.4 C)-99 F (37.2 C)] 98 F (36.7 C) (09/15 0826) Pulse Rate:  [76-127] 84 (09/15 0826) Resp:  [18-20] 18 (09/15 0826) BP: (76-134)/(45-84) 104/68 (09/15 0826) SpO2:  [98 %-100 %] 100 % (09/15 0826)  Physical Exam:  General: alert and no distress  Lungs: clear to auscultation bilaterally Breasts: normal appearance, no masses or tenderness Heart: regular rate and rhythm, S1, S2 normal, no murmur, click, rub or gallop Abdomen: soft, non-tender; bowel sounds normal; no masses,  no organomegaly Pelvis: Lochia: appropriate, Uterine Fundus: firm Extremities: DVT Evaluation: No evidence of DVT seen on physical exam. Negative Homan's sign. No cords or calf tenderness. No significant calf/ankle edema.  Recent Labs    07/17/18 0946 07/18/18 0613  HGB 9.5* 8.3*  HCT 29.6* 25.9*    Assessment/Plan: Doing well postpartum Breastfeeding Circumcision after discharge  Contraception undecided. Anemia of pregnancy, will treat with PO iron BID.  Plan for discharge today if possible, per patient request.   LOS: 1 day   Hildred Laserherry, Bobbi Yount, MD [facility] 07/18/2018 9:41 AM
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
## 2018-07-19 LAB — RPR: RPR Ser Ql: NONREACTIVE

## 2018-08-03 ENCOUNTER — Encounter: Payer: Medicaid Other | Admitting: Obstetrics and Gynecology

## 2018-08-05 ENCOUNTER — Encounter: Payer: Self-pay | Admitting: Obstetrics and Gynecology

## 2018-08-05 ENCOUNTER — Ambulatory Visit (INDEPENDENT_AMBULATORY_CARE_PROVIDER_SITE_OTHER): Payer: Medicaid Other | Admitting: Obstetrics and Gynecology

## 2018-08-05 DIAGNOSIS — M545 Low back pain, unspecified: Secondary | ICD-10-CM

## 2018-08-05 DIAGNOSIS — Z308 Encounter for other contraceptive management: Secondary | ICD-10-CM

## 2018-08-05 DIAGNOSIS — Z8659 Personal history of other mental and behavioral disorders: Secondary | ICD-10-CM

## 2018-08-05 DIAGNOSIS — Z8739 Personal history of other diseases of the musculoskeletal system and connective tissue: Secondary | ICD-10-CM

## 2018-08-05 NOTE — Progress Notes (Signed)
    GYNECOLOGY PROGRESS NOTE  Subjective:    Patient ID: Lori Willis, female    DOB: 28-Jun-1996, 22 y.o.   MRN: [id number]  HPI  Patient is a 22 y.o. [phone] female who presents for 2 week postpartum check.  She has a h/o PTSD and anxiety.  Patient notes that overall she has been ok.  She states she has had support from her parents who came into town, as well as her husband who took a week off from work. She does complain of low back pain where her epidural was, and hip pain (however she was also having issues with these areas during her pregnancy, and has a PMH of scoliosis).     Additionally, patient desires to discuss birth control options.  Patient notes that she would like to have her tubes tied as she no longer desires fertility.   The following portions of the patient's history were reviewed and updated as appropriate: allergies, current medications, past family history, past medical history, past social history, past surgical history and problem list.  Review of Systems Pertinent items noted in HPI and remainder of comprehensive ROS otherwise negative.   Objective:   Blood pressure 107/71, pulse (!) 109, height 5\' 3"  (1.6 m), weight 149 lb 12.8 oz (67.9 kg), last menstrual period 10/07/2017, currently breastfeeding. General appearance: alert and no distress Abdomen: soft, non-tender; bowel sounds normal; no masses,  no organomegaly. Fundus ~ 4 cm below umbilicus.  Back: no skin lesions, erythema, or scars, ROM normal, tendernss to palpation along midline in lumbar region, and bilateral sides at level of sacrum Pelvic: deferred Neurologic: Grossly normal   Assessment:   Postpartum state Lower back pain H/o scoliosis H/o anxiety H/o PTSD Tubal ligation counseling  Plan:   - Continue routine postpartum care. Patient appears to have a good support system, is showing no evidence of increasing anxiety or postpartum depression.  She is currently breast-feeding without  difficulty.  Will f/u for routine 6-week postpartum visit. -Patient with history of scoliosis and currently with lower back pain.  Will place new referral for physical therapy as she noted that it worked well for her during the pregnancy. -Patient desires to discuss tubal ligation. Reviewed all forms of birth control options available including abstinence; fertility period awareness methods; over the counter/barrier methods; hormonal contraceptive medication including pill, patch, ring, injection,contraceptive implant; hormonal and nonhormonal IUDs; permanent sterilization options including vasectomy and the various tubal sterilization modalities. Risks and benefits reviewed.  Questions were answered.  Information was given to patient to review.  She notes that she still desires tubal ligation.  Medicaid sterilization form signed today.  Will have patient scheduled around 30 days from now.

## 2018-08-05 NOTE — Progress Notes (Signed)
Pt is present today for a follow up after giving birth to her baby. Pt stated that she is doing well no complaints.

## 2018-08-19 ENCOUNTER — Encounter: Payer: Self-pay | Admitting: Obstetrics and Gynecology

## 2018-08-19 ENCOUNTER — Ambulatory Visit (INDEPENDENT_AMBULATORY_CARE_PROVIDER_SITE_OTHER): Payer: Medicaid Other | Admitting: Obstetrics and Gynecology

## 2018-08-19 DIAGNOSIS — Z8659 Personal history of other mental and behavioral disorders: Secondary | ICD-10-CM

## 2018-08-19 DIAGNOSIS — M5442 Lumbago with sciatica, left side: Secondary | ICD-10-CM

## 2018-08-19 DIAGNOSIS — Z308 Encounter for other contraceptive management: Secondary | ICD-10-CM

## 2018-08-19 DIAGNOSIS — O9081 Anemia of the puerperium: Secondary | ICD-10-CM

## 2018-08-19 DIAGNOSIS — G8929 Other chronic pain: Secondary | ICD-10-CM

## 2018-08-19 NOTE — Progress Notes (Signed)
Pt is present today for pre-op visit for tubal. Pt stated that she is doing well,

## 2018-08-19 NOTE — Progress Notes (Signed)
   OBSTETRICS POSTPARTUM CLINIC PROGRESS NOTE  Subjective:     Lori Willis is a 22 y.o. [phone] female who presents for a postpartum visit. She is 4 weeks postpartum following a spontaneous vaginal delivery. I have fully reviewed the prenatal and intrapartum course. She has a h/o PTSD The delivery was at 40 gestational weeks.  Anesthesia: epidural. Postpartum course has been well. Baby's course has been well. Baby is feeding by breast. Bleeding: patient has not resumed menses. Bowel function is normal. Bladder function is normal. Patient is not sexually active. Contraception method desired is tubal ligation. Postpartum depression screening: negative.  The following portions of the patient's history were reviewed and updated as appropriate: allergies, current medications, past family history, past medical history, past social history, past surgical history and problem list.  Review of Systems A comprehensive review of systems was negative except for: Musculoskeletal: positive for back pain and left leg and shoulder weakness. Patient has a h/o scoliosis, and has had back pain for a while, but notes that since her last delivery with epidural 4 weeks ago, the pain has worsened and the shoulder weakness is new.     Objective:    BP (!) 94/55   Pulse 84   Ht 5\' 3"  (1.6 m)   Wt 147 lb 11.2 oz (67 kg)   LMP 10/07/2017 (Exact Date)   Breastfeeding? Yes   BMI 26.16 kg/m   General:  alert and no distress   Breasts:  inspection negative, no nipple discharge or bleeding, no masses or nodularity palpable  Lungs: clear to auscultation bilaterally  Heart:  regular rate and rhythm, S1, S2 normal, no murmur, click, rub or gallop  Abdomen: soft, non-tender; bowel sounds normal; no masses,  no organomegaly.    Vulva:  normal  Vagina: normal vagina, no discharge, exudate, lesion, or erythema  Cervix:  no cervical motion tenderness and no lesions  Corpus: normal size, contour, position, consistency,  mobility, non-tender  Adnexa:  normal adnexa and no mass, fullness, tenderness  Rectal Exam: Not performed.         Labs:  Lab Results  Component Value Date   HGB 8.3 (L) 07/18/2018     Assessment:    Routine postpartum exam.    H/o PTSD Anxiety Back pain Tubal ligation counseling   Plan:    1. Contraception: tubal ligation desired. Patient desires permanent sterilization.  Other reversible forms of contraception were discussed with patient; she declines all other modalities. Risks of procedure discussed with patient including but not limited to: risk of regret, permanence of method, bleeding, infection, injury to surrounding organs and need for additional procedures.  Failure risk of about 1% with increased risk of ectopic gestation if pregnancy occurs was also discussed with patient.  Patient verbalized understanding of these risks and wants to proceed with sterilization.  Has signed Medicaid papers. Will schedule for 09/03/2018. Preop done today.  2. Will check Hgb for h/o anemia.  3. Stable with h/o PTSD and anxiety.  4. Will order back X-ray or back back pain. Still awaiting new appointment for pelvic floor physical therapy.    Hildred Laser, MD [facility]

## 2018-08-19 NOTE — Patient Instructions (Signed)
You are scheduled for surgery on11/11/2017.  Nothing to eat after midnight on day prior to surgery.  Do not take any medications unless recommended by your provider on day prior to surgery.  Do not take NSAIDs (Motrin, Aleve) or aspirin 7 days prior to surgery.  You may take Tylenol products for minor aches and pains.  You will receive a prescription for pain medications post-operatively.  You will be contacted by phone approximately 1 week prior to surgery to schedule pre-operative appointment.  Please call the office if you have any questions regarding your upcoming surgery.      Outpatient Surgery, Adult An outpatient surgery is a procedure that does not require an overnight stay at a hospital or clinic. A person having an outpatient surgery can go home hours after the surgery is complete. Let your health care provider know about:  Any allergies you have.  All medicines you are taking, including vitamins, herbs, eye drops, creams, and over-the-counter medicines.  Any problems you or family members have had with anesthetic medicines.  Any blood disorders you have.  Any surgeries you have had.  Any medical conditions you have.  Any use of cigarettes, alcohol, marijuana, or street drugs.  Whether you are pregnant or may be pregnant. What are the risks? The risk and complications of surgery depend on the specific procedure. Common risks and complications include:  Infection.  Bleeding.  Allergic reactions to medicines.  Temporary increase in pain.  Failure to fix the problem that the surgery was meant to fix.  Before the procedure Staying hydrated  Follow instructions from your health care provider about hydration, which may include:  Up to 2 hours before the procedure - you may continue to drink clear liquids, such as water, clear fruit juice, black coffee, and plain tea.  Eating and drinking restrictions Follow instructions from your health care provider about  eating and drinking, which may include:  8 hours before the procedure - stop eating heavy meals or foods such as meat, fried foods, or fatty foods.  6 hours before the procedure - stop eating light meals or foods, such as toast or cereal.  6 hours before the procedure - stop drinking milk or drinks that contain milk.  2 hours before the procedure - stop drinking clear liquids.  Medicines Ask your health care provider about:  Changing or stopping your regular medicines. This is especially important if you are taking diabetes medicines or blood thinners.  Taking medicines such as aspirin and ibuprofen. These medicines can thin your blood. Do not take these medicines before the procedure if your health care provider instructs you not to.  General instructions  Do not use any tobacco products, such as cigarettes, chewing tobacco, and e-cigarettes, for as long as possible before the procedure. If you need help quitting, ask your health care provider. Quitting lowers your risk for complications during and after surgery.  Plan to have someone take you home from the hospital or clinic and stay with you for 24 hours.  Ask your health care provider how your surgical site will be marked or identified.  Call your health care provider if you develop an illness or a problem that may prevent you from safely having your procedure. What happens during the procedure?  You will be settled on an operating table.  To reduce your risk of infection: ? Your health care team will wash or sanitize their hands. ? Your skin will be washed with soap. ? Hair may be  removed from the surgical area.  You will be connected to heart, blood pressure, and oxygen monitors.  You may be given one or more of the following: ? A medicine to help you relax (sedative). ? A medicine to numb the area (local anesthetic). ? A medicine to make you fall asleep (general anesthetic). ? A medicine that is injected into your  spine to numb the area below and slightly above the injection site (spinal anesthetic). ? A medicine that is injected into an area of your body to numb everything below the injection site (regional anesthetic). The specifics of the procedure will depend on the type of procedure that you are having. The procedure may also vary among health care providers and hospitals. After the procedure  Your blood pressure, heart rate, breathing rate, and blood oxygen level will be monitored until the medicines you were given have worn off.  Do not drive for 24 hours if you received a sedative.  If there are no complications, you will be allowed to go home with a responsible adult when you are awake, stable, and taking fluids well.  The surgical site will feel tender.  You may feel nauseous and have some swelling, bruising, and numbness around the surgical site. This information is not intended to replace advice given to you by your health care provider. Make sure you discuss any questions you have with your health care provider. Document Released: 07/15/2001 Document Revised: 04/01/2016 Document Reviewed: 02/10/2016 Elsevier Interactive Patient Education  [employer].

## 2018-08-20 ENCOUNTER — Telehealth: Payer: Self-pay | Admitting: Certified Nurse Midwife

## 2018-08-20 NOTE — Telephone Encounter (Signed)
Pt is s/p svd on 07/18/2018. She did not have any stitches or lacerations. Advised she could take a bath.

## 2018-08-20 NOTE — Telephone Encounter (Signed)
**  THIS IS A CHERRY PATIENT**  The patient called and asked if she could resume taking a bath/shower?  She was in yesterday and saw Dr. Valentino Saxon; however, she forgot to ask, and Dr. Valentino Saxon will not be back until Monday so I wanted to reach out to a provider who can give her the answer she is needing before Monday if possible?  Please advise, thanks.

## 2018-08-22 NOTE — H&P (View-Only) (Signed)
@[facility] @   GYNECOLOGY PREOPERATIVE HISTORY AND PHYSICAL   Subjective:  Lori Willis is a 22 y.o. [phone] here for surgical management of desired permanent sterilization. No significant preoperative concerns.  Proposed surgery: Laparoscopic Bilateral Tubal Ligation   Pertinent Gynecological History: Menses: patient has not resumed menses. Is currently postpartum.  Contraception: none Last pap: normal Date: 12/30/2017   Past Medical History:  Diagnosis Date  . Anxiety     Past Surgical History:  Procedure Laterality Date  . HEMORROIDECTOMY      OB History  Gravida Para Term Preterm AB Living  4 3 3   1 3   SAB TAB Ectopic Multiple Live Births    1   0 3    # Outcome Date GA Lbr Len/2nd Weight Sex Delivery Anes PTL Lv  4 Term 07/17/18 [redacted]w[redacted]d 07:20 / 00:15 6 lb 11.2 oz (3.04 kg) M Vag-Spont EPI  LIV  3 Term 2017 [redacted]w[redacted]d  6 lb 14 oz (3.118 kg) F Vag-Spont  Y LIV  2 Term 2015 [redacted]w[redacted]d  7 lb 15 oz (3.6 kg) M Vag-Spont  N LIV  1 TAB 2013 [redacted]w[redacted]d           Family History  Problem Relation Age of Onset  . Hypertension Mother   . Anemia Mother   . Diabetes Mother   . Hypertension Father     Social History   Socioeconomic History  . Marital status: Married    Spouse name: Not on file  . Number of children: Not on file  . Years of education: Not on file  . Highest education level: Not on file  Occupational History  . Not on file  Social Needs  . Financial resource strain: Not on file  . Food insecurity:    Worry: Not on file    Inability: Not on file  . Transportation needs:    Medical: Not on file    Non-medical: Not on file  Tobacco Use  . Smoking status: Never Smoker  . Smokeless tobacco: Never Used  Substance and Sexual Activity  . Alcohol use: No    Frequency: Never  . Drug use: No  . Sexual activity: Not Currently    Birth control/protection: None  Lifestyle  . Physical activity:    Days per week: Not on file    Minutes per session: Not on file  .  Stress: Not on file  Relationships  . Social connections:    Talks on phone: Not on file    Gets together: Not on file    Attends religious service: Not on file    Active member of club or organization: Not on file    Attends meetings of clubs or organizations: Not on file    Relationship status: Not on file  . Intimate partner violence:    Fear of current or ex partner: Not on file    Emotionally abused: Not on file    Physically abused: Not on file    Forced sexual activity: Not on file  Other Topics Concern  . Not on file  Social History Narrative  . Not on file    Current Outpatient Medications on File Prior to Visit  Medication Sig Dispense Refill  . acetaminophen (TYLENOL) 500 MG tablet Take 500 mg by mouth every 6 (six) hours as needed.    . Ascorbic Acid (VITAMIN C) 1000 MG tablet Take 1,000 mg by mouth daily.    . Cinnamon 500 MG TABS Take by mouth.    Marland Kitchen  docusate sodium (COLACE) 100 MG capsule Take 1 capsule (100 mg total) by mouth daily as needed. 30 capsule 2  . Flaxseed, Linseed, (FLAXSEED OIL) 1000 MG CAPS Take by mouth.    . pyridOXINE (VITAMIN B-6) 100 MG tablet Take 100 mg by mouth daily.     No current facility-administered medications on file prior to visit.     Allergies  Allergen Reactions  . Benadryl [Diphenhydramine] Anaphylaxis     Review of Systems Constitutional: No recent fever/chills/sweats Respiratory: No recent cough/bronchitis Cardiovascular: No chest pain Gastrointestinal: No recent nausea/vomiting/diarrhea Genitourinary: No UTI symptoms Hematologic/lymphatic:No history of coagulopathy or recent blood thinner use    Objective:   Blood pressure (!) 94/55, pulse 84, height 5\' 3"  (1.6 m), weight 147 lb 11.2 oz (67 kg), last menstrual period 10/07/2017, currently breastfeeding. CONSTITUTIONAL: Well-developed, well-nourished female in no acute distress.  HENT:  Normocephalic, atraumatic, External right and left ear normal. Oropharynx is  clear and moist EYES: Conjunctivae and EOM are normal. Pupils are equal, round, and reactive to light. No scleral icterus.  NECK: Normal range of motion, supple, no masses SKIN: Skin is warm and dry. No rash noted. Not diaphoretic. No erythema. No pallor. NEUROLOGIC: Alert and oriented to person, place, and time. Normal reflexes, muscle tone coordination. No cranial nerve deficit noted. PSYCHIATRIC: Normal mood and affect. Normal behavior. Normal judgment and thought content. CARDIOVASCULAR: Normal heart rate noted, regular rhythm RESPIRATORY: Effort and breath sounds normal, no problems with respiration noted ABDOMEN: Soft, nontender, nondistended. PELVIC: Deferred MUSCULOSKELETAL: Normal range of motion. No edema and no tenderness. 2+ distal pulses.    Labs:  Lab Results  Component Value Date   WBC 16.1 (H) 07/18/2018   HGB 8.3 (L) 07/18/2018   HCT 25.9 (L) 07/18/2018   MCV 74.2 (L) 07/18/2018   PLT 188 07/18/2018     Imaging Studies: No results found.  Assessment:    Multiparity, Desires permanent sterilization  Anemia postpartum.   Plan:    Counseling: Procedure, risks, reasons, benefits and complications (including injury to bowel, bladder, major blood vessel, ureter, bleeding, possibility of transfusion, infection, or fistula formation) reviewed in detail. Likelihood of success in alleviating the patient's condition was discussed. Routine postoperative instructions will be reviewed with the patient and her family in detail after surgery.  The patient concurred with the proposed plan, giving informed written consent for the surgery.   Preop testing ordered. Will repeat CBC.  Instructions reviewed, including NPO after midnight.      Hildred Laser, MD [facility]

## 2018-08-22 NOTE — H&P (Signed)
@[email or web address]@   GYNECOLOGY PREOPERATIVE HISTORY AND PHYSICAL   Subjective:  Lori Willis is a 22 y.o. [id number] here for surgical management of desired permanent sterilization. No significant preoperative concerns.  Proposed surgery: Laparoscopic Bilateral Tubal Ligation   Pertinent Gynecological History: Menses: patient has not resumed menses. Is currently postpartum.  Contraception: none Last pap: normal Date: 12/30/2017   Past Medical History:  Diagnosis Date  . Anxiety     Past Surgical History:  Procedure Laterality Date  . HEMORROIDECTOMY      OB History  Gravida Para Term Preterm AB Living  4 3 3   1 3  SAB TAB Ectopic Multiple Live Births    1   0 3    # Outcome Date GA Lbr Len/2nd Weight Sex Delivery Anes PTL Lv  4 Term 07/17/18 [redacted]w[redacted]d 07:20 / 00:15 6 lb 11.2 oz (3.04 kg) M Vag-Spont EPI  LIV  3 Term 2017 [redacted]w[redacted]d  6 lb 14 oz (3.118 kg) F Vag-Spont  Y LIV  2 Term 2015 [redacted]w[redacted]d  7 lb 15 oz (3.6 kg) M Vag-Spont  N LIV  1 TAB 2013 [redacted]w[redacted]d           Family History  Problem Relation Age of Onset  . Hypertension Mother   . Anemia Mother   . Diabetes Mother   . Hypertension Father     Social History   Socioeconomic History  . Marital status: Married    Spouse name: Not on file  . Number of children: Not on file  . Years of education: Not on file  . Highest education level: Not on file  Occupational History  . Not on file  Social Needs  . Financial resource strain: Not on file  . Food insecurity:    Worry: Not on file    Inability: Not on file  . Transportation needs:    Medical: Not on file    Non-medical: Not on file  Tobacco Use  . Smoking status: Never Smoker  . Smokeless tobacco: Never Used  Substance and Sexual Activity  . Alcohol use: No    Frequency: Never  . Drug use: No  . Sexual activity: Not Currently    Birth control/protection: None  Lifestyle  . Physical activity:    Days per week: Not on file    Minutes per session: Not on file  .  Stress: Not on file  Relationships  . Social connections:    Talks on phone: Not on file    Gets together: Not on file    Attends religious service: Not on file    Active member of club or organization: Not on file    Attends meetings of clubs or organizations: Not on file    Relationship status: Not on file  . Intimate partner violence:    Fear of current or ex partner: Not on file    Emotionally abused: Not on file    Physically abused: Not on file    Forced sexual activity: Not on file  Other Topics Concern  . Not on file  Social History Narrative  . Not on file    Current Outpatient Medications on File Prior to Visit  Medication Sig Dispense Refill  . acetaminophen (TYLENOL) 500 MG tablet Take 500 mg by mouth every 6 (six) hours as needed.    . Ascorbic Acid (VITAMIN C) 1000 MG tablet Take 1,000 mg by mouth daily.    . Cinnamon 500 MG TABS Take by mouth.    .   docusate sodium (COLACE) 100 MG capsule Take 1 capsule (100 mg total) by mouth daily as needed. 30 capsule 2  . Flaxseed, Linseed, (FLAXSEED OIL) 1000 MG CAPS Take by mouth.    . pyridOXINE (VITAMIN B-6) 100 MG tablet Take 100 mg by mouth daily.     No current facility-administered medications on file prior to visit.     Allergies  Allergen Reactions  . Benadryl [Diphenhydramine] Anaphylaxis     Review of Systems Constitutional: No recent fever/chills/sweats Respiratory: No recent cough/bronchitis Cardiovascular: No chest pain Gastrointestinal: No recent nausea/vomiting/diarrhea Genitourinary: No UTI symptoms Hematologic/lymphatic:No history of coagulopathy or recent blood thinner use    Objective:   Blood pressure (!) 94/55, pulse 84, height 5' 3" (1.6 m), weight 147 lb 11.2 oz (67 kg), last menstrual period 10/07/2017, currently breastfeeding. CONSTITUTIONAL: Well-developed, well-nourished female in no acute distress.  HENT:  Normocephalic, atraumatic, External right and left ear normal. Oropharynx is  clear and moist EYES: Conjunctivae and EOM are normal. Pupils are equal, round, and reactive to light. No scleral icterus.  NECK: Normal range of motion, supple, no masses SKIN: Skin is warm and dry. No rash noted. Not diaphoretic. No erythema. No pallor. NEUROLOGIC: Alert and oriented to person, place, and time. Normal reflexes, muscle tone coordination. No cranial nerve deficit noted. PSYCHIATRIC: Normal mood and affect. Normal behavior. Normal judgment and thought content. CARDIOVASCULAR: Normal heart rate noted, regular rhythm RESPIRATORY: Effort and breath sounds normal, no problems with respiration noted ABDOMEN: Soft, nontender, nondistended. PELVIC: Deferred MUSCULOSKELETAL: Normal range of motion. No edema and no tenderness. 2+ distal pulses.    Labs:  Lab Results  Component Value Date   WBC 16.1 (H) 07/18/2018   HGB 8.3 (L) 07/18/2018   HCT 25.9 (L) 07/18/2018   MCV 74.2 (L) 07/18/2018   PLT 188 07/18/2018     Imaging Studies: No results found.  Assessment:    Multiparity, Desires permanent sterilization  Anemia postpartum.   Plan:    Counseling: Procedure, risks, reasons, benefits and complications (including injury to bowel, bladder, major blood vessel, ureter, bleeding, possibility of transfusion, infection, or fistula formation) reviewed in detail. Likelihood of success in alleviating the patient's condition was discussed. Routine postoperative instructions will be reviewed with the patient and her family in detail after surgery.  The patient concurred with the proposed plan, giving informed written consent for the surgery.   Preop testing ordered. Will repeat CBC.  Instructions reviewed, including NPO after midnight.      Chyenne Sobczak, MD [facility]    

## 2018-08-31 ENCOUNTER — Other Ambulatory Visit: Payer: Self-pay

## 2018-08-31 ENCOUNTER — Encounter: Payer: Self-pay | Admitting: *Deleted

## 2018-08-31 ENCOUNTER — Emergency Department
Admission: EM | Admit: 2018-08-31 | Discharge: 2018-09-01 | Disposition: A | Discharge status: Home / Self Care | Payer: Medicaid Other | Attending: Emergency Medicine | Admitting: Emergency Medicine

## 2018-08-31 ENCOUNTER — Encounter
Admission: RE | Admit: 2018-08-31 | Discharge: 2018-08-31 | Disposition: A | Discharge status: Home / Self Care | Payer: Medicaid Other | Source: Ambulatory Visit | Attending: Obstetrics and Gynecology | Admitting: Obstetrics and Gynecology

## 2018-08-31 DIAGNOSIS — Z9889 Other specified postprocedural states: Secondary | ICD-10-CM | POA: Insufficient documentation

## 2018-08-31 DIAGNOSIS — M5410 Radiculopathy, site unspecified: Secondary | ICD-10-CM

## 2018-08-31 DIAGNOSIS — M5136 Other intervertebral disc degeneration, lumbar region: Secondary | ICD-10-CM

## 2018-08-31 DIAGNOSIS — M5126 Other intervertebral disc displacement, lumbar region: Secondary | ICD-10-CM | POA: Diagnosis not present

## 2018-08-31 DIAGNOSIS — M545 Low back pain: Secondary | ICD-10-CM | POA: Diagnosis present

## 2018-08-31 DIAGNOSIS — Z79899 Other long term (current) drug therapy: Secondary | ICD-10-CM | POA: Diagnosis not present

## 2018-08-31 DIAGNOSIS — M792 Neuralgia and neuritis, unspecified: Secondary | ICD-10-CM | POA: Insufficient documentation

## 2018-08-31 HISTORY — DX: Anemia, unspecified: D64.9

## 2018-08-31 HISTORY — DX: Headache: R51

## 2018-08-31 HISTORY — DX: Scoliosis, unspecified: M41.9

## 2018-08-31 HISTORY — DX: Panic disorder (episodic paroxysmal anxiety): F41.0

## 2018-08-31 HISTORY — DX: Other complications of anesthesia, initial encounter: T88.59XA

## 2018-08-31 HISTORY — DX: Adverse effect of unspecified anesthetic, initial encounter: T41.45XA

## 2018-08-31 HISTORY — DX: Headache, unspecified: R51.9

## 2018-08-31 HISTORY — DX: Gastro-esophageal reflux disease without esophagitis: K21.9

## 2018-08-31 HISTORY — DX: Post-traumatic stress disorder, unspecified: F43.10

## 2018-08-31 LAB — SEDIMENTATION RATE: Sed Rate: 17 mm/hr (ref 0–20)

## 2018-08-31 LAB — CBC WITH DIFFERENTIAL/PLATELET
Abs Immature Granulocytes: 0.02 10*3/uL (ref 0.00–0.07)
Basophils Absolute: 0 10*3/uL (ref 0.0–0.1)
Basophils Relative: 0 %
EOS PCT: 1 %
Eosinophils Absolute: 0.1 10*3/uL (ref 0.0–0.5)
HEMATOCRIT: 40.6 % (ref 36.0–46.0)
HEMOGLOBIN: 12.2 g/dL (ref 12.0–15.0)
Immature Granulocytes: 0 %
LYMPHS PCT: 38 %
Lymphs Abs: 2.2 10*3/uL (ref 0.7–4.0)
MCH: 25 pg — AB (ref 26.0–34.0)
MCHC: 30 g/dL (ref 30.0–36.0)
MCV: 83.2 fL (ref 80.0–100.0)
Monocytes Absolute: 0.7 10*3/uL (ref 0.1–1.0)
Monocytes Relative: 12 %
Neutro Abs: 2.8 10*3/uL (ref 1.7–7.7)
Neutrophils Relative %: 49 %
Platelets: 226 10*3/uL (ref 150–400)
RBC: 4.88 MIL/uL (ref 3.87–5.11)
RDW: 21.4 % — ABNORMAL HIGH (ref 11.5–15.5)
WBC: 5.8 10*3/uL (ref 4.0–10.5)
nRBC: 0 % (ref 0.0–0.2)

## 2018-08-31 LAB — BASIC METABOLIC PANEL
Anion gap: 10 (ref 5–15)
BUN: 11 mg/dL (ref 6–20)
CALCIUM: 9.5 mg/dL (ref 8.9–10.3)
CHLORIDE: 103 mmol/L (ref 98–111)
CO2: 26 mmol/L (ref 22–32)
Creatinine, Ser: 0.59 mg/dL (ref 0.44–1.00)
GFR calc Af Amer: >60 mL/min (ref >60)
GFR calc non Af Amer: >60 mL/min (ref >60)
GLUCOSE: 85 mg/dL (ref 70–99)
POTASSIUM: 3.7 mmol/L (ref 3.5–5.1)
Sodium: 139 mmol/L (ref 135–145)
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
## 2018-08-31 NOTE — ED Notes (Signed)
Provider at bedside

## 2018-08-31 NOTE — ED Notes (Signed)
Pt ambulatory with steady gait. C/o back pain where epidural was given in September. Children with pt. Pt does breastfeed.

## 2018-08-31 NOTE — ED Notes (Signed)
RN contacted MRI and was told it would be about 45 minutes until MRI could be performed. MD made aware. Pt in NAD at this time. Family at bedside.

## 2018-08-31 NOTE — ED Provider Notes (Signed)
Medical screening examination/treatment/procedure(s) were conducted as a shared visit with non-physician practitioner(s) and myself.  I personally evaluated the patient during the encounter.  In short, the patient has been having thoracolumbar back pain for several weeks after an epidural given before the birth of her child.  She is having intermittent but persistent and gradually worsening neurological symptoms as well, always on the left side and including numbness, tingling, and pain in both the left arm and the left leg. MRIs of the cervical, thoracic, and lumbar regions were ordered by Ms. Tomasa Blase and the patient was moved over to the main side of the emergency department and care was transferred to me.  The patient has normal vital signs, no neurological deficits, normal lab work including no leukocytosis and a normal sedimentation rate.  Differential diagnosis includes, but is not limited to, epidural abscess, vertebral osteomyelitis/discitis, musculoskeletal or nonspecific neuropathic pain, new onset multiple sclerosis, less likely neurosarcoidosis or other inflammatory or autoimmune process.  Given that the patient is not having headache nor visual changes and the symptoms seem to be primarily confined to the back although she is having the occasional upper and lower left-sided extremity neurological symptom, I think it is reasonable to continue with the MRI of the spine.  If the patient is having MS, for example, and abnormalities are identified, the patient can have a subsequent MR brain follow-up scan, but there is no need to get an MR brain as part of the current work-up.    ----------------------------------------- 3:28 AM on 09/01/2018 -----------------------------------------  No acute abnormalities identified on the MRIs.  She has a slightly bulging disc at the lower part of her lumbar spine but that does not completely explain her symptoms and may be unrelated.  I updated the patient  about the results and offered some follow-up information with neurosurgery if she would like to discuss with specialist the ongoing back pain she is having.  Particularly since she is breast-feeding I will not prescribe any medications at this time, encouraged her to continue using Tylenol and heat therapy.  I gave my usual customary return precautions and she understands and agrees with the plan.   Loleta Rose, MD 09/01/18 [phone]

## 2018-08-31 NOTE — Patient Instructions (Signed)
Your procedure is scheduled on: 09-03-18 FRIDAY Report to Same Day Surgery 2nd floor medical mall [facility] Entrance-take elevator on left to 2nd floor.  Check in with surgery information desk.) To find out your arrival time please call [phone] between 1PM - 3PM on 09-02-18 THURSDAY  Remember: Instructions that are not followed completely may result in serious medical risk, up to and including death, or upon the discretion of your surgeon and anesthesiologist your surgery may need to be rescheduled.    _x___ 1. Do not eat food after midnight the night before your procedure. NO GUM OR CANDY AFTER MIDNIGHT.  You may drink clear liquids up to 2 hours before you are scheduled to arrive at the hospital for your procedure.  Do not drink clear liquids within 2 hours of your scheduled arrival to the hospital.  Clear liquids include  --Water or Apple juice without pulp  --Clear carbohydrate beverage such as ClearFast or Gatorade  --Black Coffee or Clear Tea (No milk, no creamers, do not add anything to  the coffee or Tea   ____Ensure clear carbohydrate drink on the way to the hospital for bariatric patients  ____Ensure clear carbohydrate drink 3 hours before surgery for Dr Rutherford Nail patients if physician instructed.    __x__ 2. No Alcohol for 24 hours before or after surgery.   __x__3. No Smoking or e-cigarettes for 24 prior to surgery.  Do not use any chewable tobacco products for at least 6 hour prior to surgery   ____  4. Bring all medications with you on the day of surgery if instructed.    __x__ 5. Notify your doctor if there is any change in your medical condition     (cold, fever, infections).    x___6. On the morning of surgery brush your teeth with toothpaste and water.  You may rinse your mouth with mouth wash if you wish.  Do not swallow any toothpaste or mouthwash.   Do not wear jewelry, make-up, hairpins, clips or nail polish.  Do not wear lotions, powders, or perfumes.  You may wear deodorant.  Do not shave 48 hours prior to surgery. Men may shave face and neck.  Do not bring valuables to the hospital.    [facility] is not responsible for any belongings or valuables.               Contacts, dentures or bridgework may not be worn into surgery.  Leave your suitcase in the car. After surgery it may be brought to your room.  For patients admitted to the hospital, discharge time is determined by your treatment team.  _  Patients discharged the day of surgery will not be allowed to drive home.  You will need someone to drive you home and stay with you the night of your procedure.    Please read over the following fact sheets that you were given:   [facility] Preparing for Surgery   ____ Take anti-hypertensive listed below, cardiac, seizure, asthma, anti-reflux and psychiatric medicines. These include:  1. NONE  2.  3.  4.  5.  6.  ____Fleets enema or Magnesium Citrate as directed.   _x___ Use CHG Soap or sage wipes as directed on instruction sheet   _X___ Use inhalers on the day of surgery and bring to hospital day of surgery-USE YOUR ALBUTEROL INHALER DAY OF SURGERY AND BRING INHALER TO HOSPITAL  ____ Stop Metformin and Janumet 2 days prior to surgery.    ____ Take  1/2 of usual insulin dose the night before surgery and none on the morning surgery.   ____ Follow recommendations from Cardiologist, Pulmonologist or PCP regarding stopping Aspirin, Coumadin, Plavix ,Eliquis, Effient, or Pradaxa, and Pletal.  X____Stop Anti-inflammatories such as Advil, Aleve, Ibuprofen, Motrin, Naproxen, Naprosyn, Goodies powders or aspirin products NOW-OK to take Tylenol    _x___ Stop supplements until after surgery-STOP CINNAMON, FENUGREEK, AND FLAX SEED OIL NOW-MAY RESUME AFTER SURGERY   ____ Bring C-Pap to the hospital.

## 2018-08-31 NOTE — ED Triage Notes (Signed)
Pt has mid back pain   No known injury.  Pt had an epidural and pain is at site.  Sx began 5 weeks ago.  Pt alert.

## 2018-09-01 ENCOUNTER — Encounter
Admission: RE | Admit: 2018-09-01 | Discharge: 2018-09-01 | Disposition: A | Discharge status: Home / Self Care | Payer: Medicaid Other | Source: Ambulatory Visit | Attending: Obstetrics and Gynecology | Admitting: Obstetrics and Gynecology

## 2018-09-01 ENCOUNTER — Emergency Department: Payer: Medicaid Other

## 2018-09-01 DIAGNOSIS — Z01812 Encounter for preprocedural laboratory examination: Secondary | ICD-10-CM | POA: Diagnosis not present

## 2018-09-01 DIAGNOSIS — O9081 Anemia of the puerperium: Secondary | ICD-10-CM | POA: Insufficient documentation

## 2018-09-01 LAB — CBC
HCT: 40.4 % (ref 36.0–46.0)
HEMOGLOBIN: 11.9 g/dL — AB (ref 12.0–15.0)
MCH: 24.8 pg — AB (ref 26.0–34.0)
MCHC: 29.5 g/dL — ABNORMAL LOW (ref 30.0–36.0)
MCV: 84.2 fL (ref 80.0–100.0)
Platelets: 225 10*3/uL (ref 150–400)
RBC: 4.8 MIL/uL (ref 3.87–5.11)
RDW: 21.5 % — ABNORMAL HIGH (ref 11.5–15.5)
WBC: 5.8 10*3/uL (ref 4.0–10.5)
nRBC: 0 % (ref 0.0–0.2)

## 2018-09-01 MED ORDER — GADOBUTROL 1 MMOL/ML IV SOLN
7.0000 mL | Freq: Once | INTRAVENOUS | Status: AC | PRN
  Administered 2018-09-01: 7 mL via INTRAVENOUS

## 2018-09-01 NOTE — Discharge Instructions (Signed)
As we discussed, your work-up was reassuring today.  You have a slightly bulging disc in the lower part of your lumbar spine which could account for some of the pain you are experiencing.  We recommend you follow-up with either your regular doctor or with Dr. Riley Nearing with neurosurgery to discuss additional management recommendations.  Continue taking over-the-counter Tylenol according to label instructions and try using heat therapy.  Please follow up with your doctor as soon as possible regarding today's ED visit and your back pain.  Return to the ED for worsening back pain, fever, weakness or numbness of either leg, or if you develop either (1) an inability to urinate or have bowel movements, or (2) loss of your ability to control your bathroom functions (if you start having "accidents"), or if you develop other new symptoms that concern you.

## 2018-09-01 NOTE — ED Notes (Signed)
Pt back to room from MRI. NAD noted. Pt changing back to clothing. Children remain at bedside.

## 2018-09-03 ENCOUNTER — Ambulatory Visit: Payer: Medicaid Other | Admitting: Certified Registered"

## 2018-09-03 ENCOUNTER — Encounter
Admission: RE | Disposition: A | Discharge status: Home / Self Care | Payer: Self-pay | Source: Ambulatory Visit | Attending: Obstetrics and Gynecology

## 2018-09-03 ENCOUNTER — Ambulatory Visit
Admission: RE | Admit: 2018-09-03 | Discharge: 2018-09-03 | Disposition: A | Discharge status: Home / Self Care | Payer: Medicaid Other | Source: Ambulatory Visit | Attending: Obstetrics and Gynecology | Admitting: Obstetrics and Gynecology

## 2018-09-03 DIAGNOSIS — F419 Anxiety disorder, unspecified: Secondary | ICD-10-CM | POA: Diagnosis not present

## 2018-09-03 DIAGNOSIS — Z79899 Other long term (current) drug therapy: Secondary | ICD-10-CM | POA: Insufficient documentation

## 2018-09-03 DIAGNOSIS — Z888 Allergy status to other drugs, medicaments and biological substances status: Secondary | ICD-10-CM | POA: Diagnosis not present

## 2018-09-03 DIAGNOSIS — Z9851 Tubal ligation status: Secondary | ICD-10-CM

## 2018-09-03 DIAGNOSIS — Z302 Encounter for sterilization: Secondary | ICD-10-CM

## 2018-09-03 DIAGNOSIS — D649 Anemia, unspecified: Secondary | ICD-10-CM | POA: Diagnosis not present

## 2018-09-03 HISTORY — PX: LAPAROSCOPIC TUBAL LIGATION: SHX1937

## 2018-09-03 LAB — POCT PREGNANCY, URINE: PREG TEST UR: NEGATIVE

## 2018-09-03 SURGERY — LIGATION, FALLOPIAN TUBE, LAPAROSCOPIC
Anesthesia: General | Laterality: Bilateral

## 2018-09-03 MED ORDER — DEXAMETHASONE SODIUM PHOSPHATE 10 MG/ML IJ SOLN
INTRAMUSCULAR | Status: DC | PRN
  Administered 2018-09-03: 10 mg via INTRAVENOUS

## 2018-09-03 MED ORDER — SUCCINYLCHOLINE CHLORIDE 20 MG/ML IJ SOLN
INTRAMUSCULAR | Status: AC
  Filled 2018-09-03: qty 1

## 2018-09-03 MED ORDER — FENTANYL CITRATE (PF) 100 MCG/2ML IJ SOLN
INTRAMUSCULAR | Status: DC | PRN
  Administered 2018-09-03 (×2): 50 ug via INTRAVENOUS

## 2018-09-03 MED ORDER — FENTANYL CITRATE (PF) 100 MCG/2ML IJ SOLN
INTRAMUSCULAR | Status: AC
  Filled 2018-09-03: qty 2

## 2018-09-03 MED ORDER — ONDANSETRON HCL 4 MG/2ML IJ SOLN
INTRAMUSCULAR | Status: AC
  Filled 2018-09-03: qty 2

## 2018-09-03 MED ORDER — OXYCODONE-ACETAMINOPHEN 5-325 MG PO TABS
ORAL_TABLET | ORAL | Status: AC
  Filled 2018-09-03: qty 1

## 2018-09-03 MED ORDER — LACTATED RINGERS IV SOLN
INTRAVENOUS | Status: DC
  Administered 2018-09-03: 08:00:00 via INTRAVENOUS

## 2018-09-03 MED ORDER — FENTANYL CITRATE (PF) 100 MCG/2ML IJ SOLN
INTRAMUSCULAR | Status: AC
  Administered 2018-09-03: 25 ug via INTRAVENOUS
  Filled 2018-09-03: qty 2

## 2018-09-03 MED ORDER — PHENYLEPHRINE HCL 10 MG/ML IJ SOLN
INTRAMUSCULAR | Status: DC | PRN
  Administered 2018-09-03: 100 ug via INTRAVENOUS
  Administered 2018-09-03: 200 ug via INTRAVENOUS
  Administered 2018-09-03 (×2): 100 ug via INTRAVENOUS

## 2018-09-03 MED ORDER — FENTANYL CITRATE (PF) 100 MCG/2ML IJ SOLN
25.0000 ug | INTRAMUSCULAR | Status: AC | PRN
  Administered 2018-09-03 (×6): 25 ug via INTRAVENOUS

## 2018-09-03 MED ORDER — LIDOCAINE HCL (CARDIAC) PF 100 MG/5ML IV SOSY
PREFILLED_SYRINGE | INTRAVENOUS | Status: DC | PRN
  Administered 2018-09-03: 50 mg via INTRAVENOUS

## 2018-09-03 MED ORDER — EPHEDRINE SULFATE 50 MG/ML IJ SOLN
INTRAMUSCULAR | Status: DC | PRN
  Administered 2018-09-03: 10 mg via INTRAVENOUS

## 2018-09-03 MED ORDER — OXYCODONE-ACETAMINOPHEN 5-325 MG PO TABS: 1.0000 | ORAL_TABLET | Freq: Four times a day (QID) | ORAL | Status: DC | PRN

## 2018-09-03 MED ORDER — IBUPROFEN 800 MG PO TABS: 800.0000 mg | ORAL_TABLET | Freq: Three times a day (TID) | ORAL | 1 refills | Status: DC | PRN

## 2018-09-03 MED ORDER — ROCURONIUM BROMIDE 100 MG/10ML IV SOLN
INTRAVENOUS | Status: DC | PRN
  Administered 2018-09-03: 40 mg via INTRAVENOUS

## 2018-09-03 MED ORDER — LIDOCAINE HCL (PF) 2 % IJ SOLN
INTRAMUSCULAR | Status: AC
  Filled 2018-09-03: qty 10

## 2018-09-03 MED ORDER — ROCURONIUM BROMIDE 50 MG/5ML IV SOLN
INTRAVENOUS | Status: AC
  Filled 2018-09-03: qty 1

## 2018-09-03 MED ORDER — FAMOTIDINE 20 MG PO TABS
20.0000 mg | ORAL_TABLET | Freq: Once | ORAL | Status: AC
  Administered 2018-09-03: 20 mg via ORAL

## 2018-09-03 MED ORDER — BUPIVACAINE HCL (PF) 0.5 % IJ SOLN
INTRAMUSCULAR | Status: AC
  Filled 2018-09-03: qty 30

## 2018-09-03 MED ORDER — SUGAMMADEX SODIUM 200 MG/2ML IV SOLN
INTRAVENOUS | Status: DC | PRN
  Administered 2018-09-03: 150 mg via INTRAVENOUS

## 2018-09-03 MED ORDER — BUPIVACAINE HCL 0.5 % IJ SOLN
INTRAMUSCULAR | Status: DC | PRN
  Administered 2018-09-03: 8 mL

## 2018-09-03 MED ORDER — ONDANSETRON HCL 4 MG/2ML IJ SOLN
INTRAMUSCULAR | Status: DC | PRN
  Administered 2018-09-03: 4 mg via INTRAVENOUS

## 2018-09-03 MED ORDER — KETOROLAC TROMETHAMINE 30 MG/ML IJ SOLN
INTRAMUSCULAR | Status: AC
  Filled 2018-09-03: qty 1

## 2018-09-03 MED ORDER — SILVER NITRATE-POT NITRATE 75-25 % EX MISC
CUTANEOUS | Status: DC | PRN
  Administered 2018-09-03: 2 via TOPICAL

## 2018-09-03 MED ORDER — SUGAMMADEX SODIUM 200 MG/2ML IV SOLN
INTRAVENOUS | Status: AC
  Filled 2018-09-03: qty 2

## 2018-09-03 MED ORDER — FENTANYL CITRATE (PF) 100 MCG/2ML IJ SOLN
25.0000 ug | INTRAMUSCULAR | Status: AC | PRN
  Administered 2018-09-03 (×2): 25 ug via INTRAVENOUS

## 2018-09-03 MED ORDER — GLYCOPYRROLATE 0.2 MG/ML IJ SOLN
INTRAMUSCULAR | Status: DC | PRN
  Administered 2018-09-03: 0.2 mg via INTRAVENOUS

## 2018-09-03 MED ORDER — MIDAZOLAM HCL 2 MG/2ML IJ SOLN
INTRAMUSCULAR | Status: DC | PRN
  Administered 2018-09-03: 2 mg via INTRAVENOUS

## 2018-09-03 MED ORDER — PROPOFOL 10 MG/ML IV BOLUS
INTRAVENOUS | Status: DC | PRN
  Administered 2018-09-03: 160 mg via INTRAVENOUS

## 2018-09-03 MED ORDER — DEXAMETHASONE SODIUM PHOSPHATE 10 MG/ML IJ SOLN
INTRAMUSCULAR | Status: AC
  Filled 2018-09-03: qty 1

## 2018-09-03 MED ORDER — MIDAZOLAM HCL 2 MG/2ML IJ SOLN
INTRAMUSCULAR | Status: AC
  Filled 2018-09-03: qty 2

## 2018-09-03 MED ORDER — GLYCOPYRROLATE 0.2 MG/ML IJ SOLN
INTRAMUSCULAR | Status: AC
  Filled 2018-09-03: qty 1

## 2018-09-03 MED ORDER — OXYCODONE-ACETAMINOPHEN 5-325 MG PO TABS: 1.0000 | ORAL_TABLET | Freq: Four times a day (QID) | ORAL | 0 refills | Status: DC | PRN

## 2018-09-03 MED ORDER — PROMETHAZINE HCL 25 MG/ML IJ SOLN: 6.2500 mg | INTRAMUSCULAR | Status: DC | PRN

## 2018-09-03 MED ORDER — KETOROLAC TROMETHAMINE 30 MG/ML IJ SOLN
INTRAMUSCULAR | Status: DC | PRN
  Administered 2018-09-03: 30 mg via INTRAVENOUS

## 2018-09-03 MED ORDER — OXYCODONE HCL 5 MG PO TABS
ORAL_TABLET | ORAL | Status: AC
  Filled 2018-09-03: qty 1

## 2018-09-03 MED ORDER — FAMOTIDINE 20 MG PO TABS
ORAL_TABLET | ORAL | Status: AC
  Administered 2018-09-03: 20 mg via ORAL
  Filled 2018-09-03: qty 1

## 2018-09-03 MED ORDER — PROPOFOL 10 MG/ML IV BOLUS
INTRAVENOUS | Status: AC
  Filled 2018-09-03: qty 20

## 2018-09-03 MED ORDER — OXYCODONE HCL 5 MG/5ML PO SOLN: 5.0000 mg | Freq: Once | ORAL | Status: AC | PRN

## 2018-09-03 MED ORDER — OXYCODONE HCL 5 MG PO TABS
5.0000 mg | ORAL_TABLET | Freq: Once | ORAL | Status: AC | PRN
  Administered 2018-09-03: 5 mg via ORAL

## 2018-09-03 MED ORDER — MEPERIDINE HCL 50 MG/ML IJ SOLN: 6.2500 mg | INTRAMUSCULAR | Status: DC | PRN

## 2018-09-03 SURGICAL SUPPLY — 30 items
BLADE SURG SZ11 CARB STEEL (BLADE) ×3 IMPLANT
CATH ROBINSON RED A/P 16FR (CATHETERS) ×3 IMPLANT
CHLORAPREP W/TINT 26ML (MISCELLANEOUS) ×3 IMPLANT
CLIP FILSHIE TUBAL LIGA STRL (Clip) ×3 IMPLANT
COVER WAND RF STERILE (DRAPES) ×3 IMPLANT
DERMABOND ADVANCED (GAUZE/BANDAGES/DRESSINGS) ×2
DERMABOND ADVANCED .7 DNX12 (GAUZE/BANDAGES/DRESSINGS) ×1 IMPLANT
GLOVE BIO SURGEON STRL SZ 6.5 (GLOVE) ×6 IMPLANT
GLOVE BIO SURGEONS STRL SZ 6.5 (GLOVE) ×3
GLOVE INDICATOR 7.0 STRL GRN (GLOVE) ×9 IMPLANT
GOWN STRL REUS W/ TWL LRG LVL3 (GOWN DISPOSABLE) ×2 IMPLANT
GOWN STRL REUS W/TWL LRG LVL3 (GOWN DISPOSABLE) ×4
KIT PINK PAD W/HEAD ARE REST (MISCELLANEOUS) ×3
KIT PINK PAD W/HEAD ARM REST (MISCELLANEOUS) ×1 IMPLANT
KIT TURNOVER CYSTO (KITS) ×3 IMPLANT
LABEL OR SOLS (LABEL) ×3 IMPLANT
NS IRRIG 500ML POUR BTL (IV SOLUTION) ×3 IMPLANT
PACK GYN LAPAROSCOPIC (MISCELLANEOUS) ×3 IMPLANT
PAD OB MATERNITY 4.3X12.25 (PERSONAL CARE ITEMS) ×3 IMPLANT
PAD PREP 24X41 OB/GYN DISP (PERSONAL CARE ITEMS) ×3 IMPLANT
SHEARS ENDO 5MM LNG  ASK BEFOR (MISCELLANEOUS)
SHEARS ENDO 5MM LNG ASK BEFOR (MISCELLANEOUS) IMPLANT
SUT VIC AB 0 CT1 36 (SUTURE) ×3 IMPLANT
SUT VIC AB 0 CT2 27 (SUTURE) ×3 IMPLANT
SUT VIC AB 4-0 FS2 27 (SUTURE) ×3 IMPLANT
SUT VIC AB 4-0 SH 27 (SUTURE) ×2
SUT VIC AB 4-0 SH 27XANBCTRL (SUTURE) ×1 IMPLANT
SYSTEM WECK SHIELD CLOSURE (TROCAR) ×3 IMPLANT
TROCAR ENDO BLADELESS 11MM (ENDOMECHANICALS) ×3 IMPLANT
TUBING INSUFFLATION (TUBING) ×3 IMPLANT

## 2018-09-03 NOTE — Interval H&P Note (Signed)
History and Physical Interval Note:  09/03/2018 9:09 AM  Lori Willis  has presented today for surgery, with the diagnosis of DESIRES STERILIZATION  The various methods of treatment have been discussed with the patient and family. After consideration of risks, benefits and other options for treatment, the patient has consented to  Procedure(s): LAPAROSCOPIC BILATERAL TUBAL LIGATION (Bilateral) as a surgical intervention .  The patient's history has been reviewed, patient examined, no change in status, stable for surgery.  I have reviewed the patient's chart and labs.  Questions were answered to the patient's satisfaction.     Hildred Laser, MD [facility]

## 2018-09-03 NOTE — Anesthesia Procedure Notes (Signed)
Procedure Name: Intubation Performed by: Nickolai Rinks, CRNA Pre-anesthesia Checklist: Patient identified, Patient being monitored, Timeout performed, Emergency Drugs available and Suction available Patient Re-evaluated:Patient Re-evaluated prior to induction Oxygen Delivery Method: Circle system utilized Preoxygenation: Pre-oxygenation with 100% oxygen Induction Type: IV induction Ventilation: Mask ventilation without difficulty Laryngoscope Size: Miller and 2 Grade View: Grade I Tube type: Oral Tube size: 7.0 mm Number of attempts: 1 Airway Equipment and Method: Stylet Placement Confirmation: ETT inserted through vocal cords under direct vision,  positive ETCO2 and breath sounds checked- equal and bilateral Secured at: 21 cm Tube secured with: Tape Dental Injury: Teeth and Oropharynx as per pre-operative assessment        

## 2018-09-03 NOTE — Discharge Instructions (Addendum)
Remove bandage after 3-4 days.    Laparoscopic Tubal Ligation, Care After Refer to this sheet in the next few weeks. These instructions provide you with information about caring for yourself after your procedure. Your health care provider may also give you more specific instructions. Your treatment has been planned according to current medical practices, but problems sometimes occur. Call your health care provider if you have any problems or questions after your procedure. What can I expect after the procedure? After the procedure, it is common to have:  A sore throat.  Discomfort in your shoulder.  Mild discomfort or cramping in your abdomen.  Gas pains.  Pain or soreness in the area where the surgical cut (incision) was made.  A bloated feeling.  Tiredness.  Nausea.  Vomiting.  Follow these instructions at home: Medicines  Take over-the-counter and prescription medicines only as told by your health care provider.  Do not take aspirin because it can cause bleeding.  Do not drive or operate heavy machinery while taking prescription pain medicine. Activity  Rest for the rest of the day.  Return to your normal activities as told by your health care provider. Ask your health care provider what activities are safe for you. Incision care   Follow instructions from your health care provider about how to take care of your incision. Make sure you: ? Wash your hands with soap and water before you change your bandage (dressing). If soap and water are not available, use hand sanitizer. ? Change your dressing as told by your health care provider. ? Leave stitches (sutures) in place. They may need to stay in place for 2 weeks or longer.  Check your incision area every day for signs of infection. Check for: ? More redness, swelling, or pain. ? More fluid or blood. ? Warmth. ? Pus or a bad smell. Other Instructions  Do not take baths, swim, or use a hot tub until your health  care provider approves. You may take showers.  Keep all follow-up visits as told by your health care provider. This is important.  Have someone help you with your daily household tasks for the first few days. Contact a health care provider if:  You have more redness, swelling, or pain around your incision.  Your incision feels warm to the touch.  You have pus or a bad smell coming from your incision.  The edges of your incision break open after the sutures have been removed.  Your pain does not improve after 2-3 days.  You have a rash.  You repeatedly become dizzy or light-headed.  Your pain medicine is not helping.  You are constipated. Get help right away if:  You have a fever.  You faint.  You have increasing pain in your abdomen.  You have severe pain in one or both of your shoulders.  You have fluid or blood coming from your sutures or from your vagina.  You have shortness of breath or difficulty breathing.  You have chest pain or leg pain.  You have ongoing nausea, vomiting, or diarrhea. This information is not intended to replace advice given to you by your health care provider. Make sure you discuss any questions you have with your health care provider. Document Released: 05/09/2005 Document Revised: 03/24/2016 Document Reviewed: 09/30/2015 Elsevier Interactive Patient Education  2018 [employer]    AMBULATORY SURGERY  DISCHARGE INSTRUCTIONS   1) The drugs that you were given will stay in your system until tomorrow so for the  next 24 hours you should not:  A) Drive an automobile B) Make any legal decisions C) Drink any alcoholic beverage   2) You may resume regular meals tomorrow.  Today it is better to start with liquids and gradually work up to solid foods.  You may eat anything you prefer, but it is better to start with liquids, then soup and crackers, and gradually work up to solid foods.   3) Please notify your doctor immediately if you  have any unusual bleeding, trouble breathing, redness and pain at the surgery site, drainage, fever, or pain not relieved by medication.    4) Additional Instructions:        Please contact your physician with any problems or Same Day Surgery at [phone], Monday through Friday 6 am to 4 pm, or [place] [facility] number at [phone].

## 2018-09-03 NOTE — Anesthesia Post-op Follow-up Note (Signed)
Anesthesia QCDR form completed.        

## 2018-09-03 NOTE — Anesthesia Postprocedure Evaluation (Signed)
Anesthesia Post Note  Patient: Lori Willis  Procedure(s) Performed: LAPAROSCOPIC BILATERAL TUBAL LIGATION VIA FILSHIE CLIPS (Bilateral )  Patient location during evaluation: PACU Anesthesia Type: General Level of consciousness: awake and alert and oriented Pain management: pain level controlled Vital Signs Assessment: post-procedure vital signs reviewed and stable Respiratory status: spontaneous breathing, nonlabored ventilation and respiratory function stable Cardiovascular status: blood pressure returned to baseline and stable Postop Assessment: no signs of nausea or vomiting Anesthetic complications: no     Last Vitals:  Vitals:   09/03/18 1057 09/03/18 1112  BP: 131/82 (!) 124/93  Pulse:    Resp: 15 16  Temp: (!) 36.2 C   SpO2: 100% 100%    Last Pain:  Vitals:   09/03/18 1148  TempSrc:   PainSc: 7                  Lynne Takemoto

## 2018-09-03 NOTE — Anesthesia Preprocedure Evaluation (Signed)
Anesthesia Evaluation  Patient identified by MRN, date of birth, ID band Patient awake    Reviewed: Allergy & Precautions, NPO status , Patient's Chart, lab work & pertinent test results  History of Anesthesia Complications Negative for: history of anesthetic complications  Airway Mallampati: I  TM Distance: >3 FB Neck ROM: Full    Dental no notable dental hx.    Pulmonary neg pulmonary ROS, neg sleep apnea, neg COPD,    breath sounds clear to auscultation- rhonchi (-) wheezing      Cardiovascular Exercise Tolerance: Good (-) hypertension(-) CAD, (-) Past MI, (-) Cardiac Stents and (-) CABG  Rhythm:Regular Rate:Normal - Systolic murmurs and - Diastolic murmurs    Neuro/Psych  Headaches, PSYCHIATRIC DISORDERS Anxiety    GI/Hepatic Neg liver ROS, GERD  ,  Endo/Other  negative endocrine ROSneg diabetes  Renal/GU negative Renal ROS     Musculoskeletal negative musculoskeletal ROS (+)   Abdominal (+) - obese,   Peds  Hematology  (+) anemia ,   Anesthesia Other Findings Past Medical History: No date: Anemia No date: Anxiety No date: Complication of anesthesia     Comment:  PT STATES SINCE HER EPIDURAL IN SEPT 2018 SHE HAS HAD               LEFT ARM TINGLING/NUMBNESS DOWN ENTIRE ARM No date: GERD (gastroesophageal reflux disease)     Comment:  NO MEDS No date: Headache     Comment:  MIGRAINES No date: Panic attack No date: PTSD (post-traumatic stress disorder) No date: Scoliosis   Reproductive/Obstetrics (+) Breast feeding                              Anesthesia Physical Anesthesia Plan  ASA: II  Anesthesia Plan: General   Post-op Pain Management:    Induction: Intravenous  PONV Risk Score and Plan: 2 and Ondansetron, Midazolam and Dexamethasone  Airway Management Planned: Oral ETT  Additional Equipment:   Intra-op Plan:   Post-operative Plan: Extubation in OR  Informed  Consent: I have reviewed the patients History and Physical, chart, labs and discussed the procedure including the risks, benefits and alternatives for the proposed anesthesia with the patient or authorized representative who has indicated his/her understanding and acceptance.   Dental advisory given  Plan Discussed with: CRNA and Anesthesiologist  Anesthesia Plan Comments:         Anesthesia Quick Evaluation

## 2018-09-03 NOTE — Transfer of Care (Signed)
Immediate Anesthesia Transfer of Care Note  Patient: Lori Willis  Procedure(s) Performed: LAPAROSCOPIC BILATERAL TUBAL LIGATION VIA FILSHIE CLIPS (Bilateral )  Patient Location: PACU  Anesthesia Type:General  Level of Consciousness: sedated  Airway & Oxygen Therapy: Patient Spontanous Breathing and Patient connected to face mask oxygen  Post-op Assessment: Report given to RN and Post -op Vital signs reviewed and stable  Post vital signs: Reviewed  Last Vitals:  Vitals Value Taken Time  BP 131/82 09/03/2018 10:57 AM  Temp    Pulse    Resp 15 09/03/2018 10:57 AM  SpO2 100 % 09/03/2018 10:57 AM    Last Pain:  Vitals:   09/03/18 0737  TempSrc: Tympanic         Complications: No apparent anesthesia complications

## 2018-09-03 NOTE — Op Note (Signed)
Procedure(s): LAPAROSCOPIC BILATERAL TUBAL LIGATION VIA FILSHIE CLIPS Procedure Note  Lori Willis female 22 y.o. 09/03/2018  Indications: The patient is a 22 y.o. [phone] female, 6 weeks postpartum, with undesired fertility, desiring permanent sterilization  Pre-operative Diagnosis: Undesired fertility  Post-operative Diagnosis: Same  Surgeon: Hildred Laser, MD  Assistants: Surgical Scrub Tech  Anesthesia: General endotracheal anesthesia  Procedure Details: The patient was seen in the Holding Room. The risks, benefits, complications, treatment options, and expected outcomes were discussed with the patient.  The patient concurred with the proposed plan, giving informed consent.  The site of surgery properly noted/marked. The patient was taken to the Operating Room, identified as Lori Willis and the procedure verified as Procedure(s) (LRB): LAPAROSCOPIC BILATERAL TUBAL LIGATION VIA FILSHIE CLIPS (Bilateral). A Time Out was held and the above information confirmed.  The patient was taken to the operating room where general anesthesia was obtained without difficulty.  She was then placed in the dorsal lithotomy position and prepared and draped in sterile fashion.  After an adequate timeout was performed, a bivalved speculum was then placed in the patient's vagina, and the anterior lip of cervix grasped with the single-tooth tenaculum.  A Hulka clamp was placed for uterine manipulation. The speculum was removed from the vagina.  Attention was then turned to the patient's abdomen where a 11-mm skin incision was made in the umbilical fold.  The Optiview 11-mm trocar and sleeve were then advanced without difficulty with the laparoscope under direct visualization into the abdomen.  The abdomen was then insufflated with carbon dioxide gas and adequate pneumoperitoneum was obtained.  A survey of the patient's pelvis and abdomen revealed entirely normal anatomy.  The fallopian tubes were  observed and found to be normal in appearance. The Filshie clip applicator was placed through the operative port, and a Filshie clip was placed on the right fallopian tube ,about 2 cm from the cornual attachment, with care given to incorporate the underlying mesosalpinx.  A similar process was carried out on the contralateral side allowing for bilateral tubal sterilization.   Good hemostasis was noted overall.  The instruments were then removed from the patient's abdomen and the fascial incision was repaired with 0 Vicryl, and the skin was closed 4-0 Vicryl.  The incision was injected with approximately 8 ml of 0.5% Marcaine.  The uterine manipulator was removed from the vagina without complications.  There was oozing noted from the tenaculum site. Hemostasis was achieved by use of applied pressure and silver nitrate sticks.  The patient tolerated the procedure well.  Sponge, lap, and needle counts were correct times two.  The patient was then taken to the recovery room awake, extubated and in stable condition.  The patient will be discharged to home as per PACU criteria.  Routine postoperative instructions given.  She was prescribed Percocet, Ibuprofen and Colace.  She will follow up in the clinic in 1 week for postoperative evaluation.   Findings: The uterus was sounded to 10 cm. Fallopian tubes and ovaries appeared normal.   Estimated Blood Loss:  20 ml      Drains: straight catheterization prior to procedure with 100 ml of clear urine         Total IV Fluids:  700 ml  Specimens: None         Implants: None         Complications:  None; patient tolerated the procedure well.         Disposition: PACU - hemodynamically stable.  Condition: stable    Hildred Laser, MD [facility]

## 2018-09-03 NOTE — OR Nursing (Signed)
Discharge instructions discussed with pt and husband. Both voice understanding. 

## 2018-09-03 NOTE — Progress Notes (Signed)
Entered in error by Nicholes Rough, RN

## 2018-09-06 ENCOUNTER — Telehealth: Payer: Self-pay | Admitting: Obstetrics and Gynecology

## 2018-09-06 ENCOUNTER — Encounter: Payer: Self-pay | Admitting: Obstetrics and Gynecology

## 2018-09-06 NOTE — Telephone Encounter (Signed)
Patient called stating she had a tubal ligation on Friday. She had broken out in a rash on both sides of her abdomen and itches a lot. She would like a call back ASAP.Thanks

## 2018-09-06 NOTE — Telephone Encounter (Signed)
responsed to pt's mychart message before I noticed the phone encounter.

## 2018-09-06 NOTE — Telephone Encounter (Signed)
Please see mychart message.

## 2018-09-09 ENCOUNTER — Telehealth: Payer: Self-pay | Admitting: Obstetrics and Gynecology

## 2018-09-09 NOTE — Telephone Encounter (Signed)
Spoke with pt and she stated that her rash is not clearing up. Pt was advised to try Zyrtec to help with the itching. Pt stated that she would try that and continue to use the cream. Pt has an appointment on Monday, Sep 13, 2018.

## 2018-09-09 NOTE — Telephone Encounter (Signed)
The patient states she has an ALLERGY TO BENADRYL, and she has a rash on both sides of her abdomen and top under her breasts since Sunday and it is either from the cleaner or tape from her surgery, and she has been putting cream/lotion on it and washing with antibacterial soap, and she is still having severe itching and needs to speak to her nurse/provider, please advise, thanks.

## 2018-09-13 ENCOUNTER — Encounter: Payer: Self-pay | Admitting: Obstetrics and Gynecology

## 2018-09-13 ENCOUNTER — Ambulatory Visit (INDEPENDENT_AMBULATORY_CARE_PROVIDER_SITE_OTHER): Payer: Medicaid Other | Admitting: Obstetrics and Gynecology

## 2018-09-13 VITALS — BP 100/69 | HR 99 | Ht 63.0 in | Wt 149.0 lb

## 2018-09-13 DIAGNOSIS — G8929 Other chronic pain: Secondary | ICD-10-CM

## 2018-09-13 DIAGNOSIS — L231 Allergic contact dermatitis due to adhesives: Secondary | ICD-10-CM

## 2018-09-13 DIAGNOSIS — Z09 Encounter for follow-up examination after completed treatment for conditions other than malignant neoplasm: Secondary | ICD-10-CM

## 2018-09-13 DIAGNOSIS — M5442 Lumbago with sciatica, left side: Secondary | ICD-10-CM

## 2018-09-13 DIAGNOSIS — Z9851 Tubal ligation status: Secondary | ICD-10-CM

## 2018-09-13 DIAGNOSIS — R194 Change in bowel habit: Secondary | ICD-10-CM

## 2018-09-13 MED ORDER — HYDROCORTISONE 1 % EX CREA: 1.0000 "application " | TOPICAL_CREAM | Freq: Two times a day (BID) | CUTANEOUS | 0 refills | Status: DC

## 2018-09-13 MED ORDER — HYDROXYZINE HCL 25 MG PO TABS: 25.0000 mg | ORAL_TABLET | Freq: Four times a day (QID) | ORAL | 2 refills | Status: DC | PRN

## 2018-09-13 NOTE — Progress Notes (Signed)
Pt is present today for follow up after tubal. Pt has rashes on both side of her abd that itches. No other complaints.

## 2018-09-13 NOTE — Progress Notes (Signed)
    OBSTETRICS/GYNECOLOGY POST-OPERATIVE CLINIC VISIT  Subjective:     Waynesha Rammel is a 22 y.o. female who presents to the clinic 2 weeks status post laparoscopic tubal ligation for undesired fertility. Eating a regular diet without difficulty. Bowel movements are normal. Pain is controlled without any medications.  She does report complaints of a rash that has developed since the surgery on her abdomen. Has tried OTC Hydrocortisone cream and Zyrtec for itching with minimal relief.   The following portions of the patient's history were reviewed and updated as appropriate: allergies, current medications, past family history, past medical history, past social history, past surgical history and problem list.  Review of Systems A comprehensive review of systems was negative except for: Gastrointestinal: positive for change in bowel habits (notes alternating loose stools and constipation, crampy pain with bowel movements, bloating).  This has been ongoing since before her pregnancy but has never seen anyone for this.    Objective:    BP 100/69   Pulse 99   Ht 5\' 3"  (1.6 m)   Wt 149 lb (67.6 kg)   LMP 10/07/2017 (Exact Date)   BMI 26.39 kg/m  General:  alert and no distress  Abdomen: soft, bowel sounds active, non-tender  Incision:   healing well, no drainage, no erythema, no hernia, no seroma, no swelling, no dehiscence, incision well approximated  Skin: Macular rash on both lateral sides of abdomen, right>left, somewhat faint.    Pathology:  None  Assessment:    Postop check  S/P tubal ligation  Change in bowel habits - Plan: Ambulatory referral to Gastroenterology  Chronic midline low back pain with left-sided sciatica - Plan: Ambulatory referral to Physical Therapy  Allergic contact dermatitis due to adhesives   Plan:   1. Will prescribe Atarax and 1% hydrocortisone cream for contact dermatitis (likely due to adhesives on drapes) 2. Wound healing well. 3. Operative  findings again reviewed. No pathology.  4. Activity restrictions: none 5. Anticipated return to work: not applicable. 6. Referral placed to GI for workup of bowel symptoms. Based on description, may likely have IBS.   7. Referral placed again back to physical therapy for chronic low back pain, exacerbated by recent pregnancy. 8. Follow up: 6 months for annual exam.     Hildred Laser, MD [facility]

## 2018-09-24 ENCOUNTER — Other Ambulatory Visit: Payer: Self-pay

## 2018-09-24 ENCOUNTER — Ambulatory Visit: Payer: Medicaid Other | Attending: Obstetrics and Gynecology | Admitting: Physical Therapy

## 2018-09-24 DIAGNOSIS — M4125 Other idiopathic scoliosis, thoracolumbar region: Secondary | ICD-10-CM | POA: Diagnosis present

## 2018-09-24 DIAGNOSIS — G8929 Other chronic pain: Secondary | ICD-10-CM | POA: Diagnosis present

## 2018-09-24 DIAGNOSIS — M25571 Pain in right ankle and joints of right foot: Secondary | ICD-10-CM | POA: Insufficient documentation

## 2018-09-24 DIAGNOSIS — M5442 Lumbago with sciatica, left side: Secondary | ICD-10-CM | POA: Diagnosis present

## 2018-09-24 DIAGNOSIS — M25572 Pain in left ankle and joints of left foot: Secondary | ICD-10-CM | POA: Insufficient documentation

## 2018-09-24 DIAGNOSIS — M533 Sacrococcygeal disorders, not elsewhere classified: Secondary | ICD-10-CM | POA: Diagnosis present

## 2018-09-24 NOTE — Patient Instructions (Addendum)
   Clam Shell 45 Degrees   Lying with hips and knees bent 45, one pillow between knees and ankles. Lift knee with exhale. Be sure pelvis does not roll backward. Do not arch back. Do 10 times, each leg, 2 times per day.    Complimentary stretches   Arrow ElectronicsFigure-4  Cross over   5 breaths  _______  Deep core level 2  6 min   _______ Body mechanics with   Body mechanics training:  Lifting: Squat or ski-track standing (one foot forward, shift weight into back leg as you lift)  Stand at an angle to keep her close to your center    Breastfeeding: Extra pillow under armpit in addition to boppy pillow Carseat in a chair 45 deg to your feeding chair to place her in after feeding so you can rise out of the chair without weight    Changing table:  Standing with ski track stance 45 deg to table instead of forward bending.   Bending down into stroller: Standing with ski track stance 45 deg to table instead of forward bending.   Standup first before holding him from the cough after changing diapers,  Pick up from mini squat
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
## 2018-09-24 NOTE — Addendum Note (Signed)
Addended by: Mariane MastersYEUNG, SHIN-YIING on: 09/24/2018 11:59 AM   Modules accepted: Orders

## 2018-10-07 ENCOUNTER — Ambulatory Visit: Payer: Medicaid Other | Attending: Obstetrics and Gynecology | Admitting: Physical Therapy

## 2018-10-07 DIAGNOSIS — M25572 Pain in left ankle and joints of left foot: Secondary | ICD-10-CM | POA: Insufficient documentation

## 2018-10-07 DIAGNOSIS — M5442 Lumbago with sciatica, left side: Secondary | ICD-10-CM | POA: Insufficient documentation

## 2018-10-07 DIAGNOSIS — M533 Sacrococcygeal disorders, not elsewhere classified: Secondary | ICD-10-CM | POA: Insufficient documentation

## 2018-10-07 DIAGNOSIS — M25571 Pain in right ankle and joints of right foot: Secondary | ICD-10-CM | POA: Insufficient documentation

## 2018-10-07 DIAGNOSIS — G8929 Other chronic pain: Secondary | ICD-10-CM | POA: Insufficient documentation

## 2018-10-07 DIAGNOSIS — M791 Myalgia, unspecified site: Secondary | ICD-10-CM | POA: Insufficient documentation

## 2018-10-07 DIAGNOSIS — M4125 Other idiopathic scoliosis, thoracolumbar region: Secondary | ICD-10-CM | POA: Insufficient documentation

## 2018-10-07 NOTE — Patient Instructions (Signed)
Lengthen Back rib by _ shoulder    Pull L_ arm overhead over mattress, bed:  Breathing To make space back there        Stretching between mid shoulder blades  1) Elbows straight,thumbs down, cross wrists over Bring knuckles by your chest and end with them under chin  Reverse 5 reps  And switch to the other side  2) L  hug around on the  _ edge of the bed  Breathing  To make space back there    Mobilize shoulder blade movement while pressing back of head down Angel wings up, bat wings down     Wall lean for scoliosis   Forearm slide against wall as you stand perpendicular to wall, band under feet, feet hip width apart,  Opposite elbow by side, , imagine holding pencil under armpit as you lean toward wall, lowering forearm against the wall and opposite hand pulls band without letting elbow move away from side body  And slide back up , straightening body    Make sure the upper trapezius muscle does not hike up to ear as band is being pulled as you lean towards wall    band  10 x 2    Leaning toward R only to open the L flank area
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
## 2018-10-14 ENCOUNTER — Ambulatory Visit: Payer: Medicaid Other | Admitting: Physical Therapy

## 2018-10-14 DIAGNOSIS — M791 Myalgia, unspecified site: Secondary | ICD-10-CM

## 2018-10-14 DIAGNOSIS — G8929 Other chronic pain: Secondary | ICD-10-CM

## 2018-10-14 DIAGNOSIS — M25572 Pain in left ankle and joints of left foot: Secondary | ICD-10-CM

## 2018-10-14 DIAGNOSIS — M25571 Pain in right ankle and joints of right foot: Secondary | ICD-10-CM

## 2018-10-14 DIAGNOSIS — M4125 Other idiopathic scoliosis, thoracolumbar region: Secondary | ICD-10-CM

## 2018-10-14 DIAGNOSIS — M533 Sacrococcygeal disorders, not elsewhere classified: Secondary | ICD-10-CM

## 2018-10-14 DIAGNOSIS — M5442 Lumbago with sciatica, left side: Secondary | ICD-10-CM

## 2018-10-14 NOTE — Patient Instructions (Signed)
   band under ballmounds  while laying on back w/ knees bent  "W" exercise  10 reps x 2 sets   Band is placed under feet, knees bent, feet are hip width apart Hold band with thumbs point out, keep upper arm and elbow touching the bed the whole time  - inhale and then exhale pull bands by bending elbows hands move in a "w"  (feel shoulder blades squeezing)    ___________  Wall stretch with leaning temple by wall to stretch L neck muscle   ____________ Strengthening:  Seated: pressing hands on chair , shoulders down and back, chin back to align with shoulder.   ____________  Lengthen L midback muscles in bed, pulling the edge or seated using towel behind back, elbow up to sky
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
## 2018-10-22 ENCOUNTER — Encounter: Payer: Self-pay | Admitting: Gastroenterology

## 2018-10-22 ENCOUNTER — Ambulatory Visit: Payer: Medicaid Other | Admitting: Gastroenterology

## 2018-10-28 ENCOUNTER — Encounter: Payer: Medicaid Other | Admitting: Physical Therapy

## 2018-11-05 ENCOUNTER — Encounter: Payer: Medicaid Other | Admitting: Physical Therapy

## 2018-11-11 ENCOUNTER — Encounter: Payer: Medicaid Other | Admitting: Physical Therapy

## 2018-11-18 ENCOUNTER — Encounter: Payer: Medicaid Other | Admitting: Physical Therapy

## 2018-11-25 ENCOUNTER — Encounter: Payer: Medicaid Other | Admitting: Physical Therapy

## 2019-06-01 ENCOUNTER — Ambulatory Visit (INDEPENDENT_AMBULATORY_CARE_PROVIDER_SITE_OTHER)
Admission: RE | Admit: 2019-06-01 | Discharge: 2019-06-01 | Disposition: A | Discharge status: Home / Self Care | Payer: Medicaid Other | Source: Ambulatory Visit

## 2019-06-01 DIAGNOSIS — R21 Rash and other nonspecific skin eruption: Secondary | ICD-10-CM

## 2019-06-01 DIAGNOSIS — L299 Pruritus, unspecified: Secondary | ICD-10-CM

## 2019-06-01 MED ORDER — BETAMETHASONE DIPROPIONATE 0.05 % EX OINT: TOPICAL_OINTMENT | Freq: Two times a day (BID) | CUTANEOUS | 0 refills | Status: DC

## 2019-06-01 NOTE — ED Provider Notes (Signed)
Virtual Visit via Video Note:  Lori Willis  initiated request for Telemedicine visit with [facility]gent [facility] team. I connected with Lori Willis  on 06/01/2019 at 9:27 AM  for a synchronized telemedicine visit using a video enabled HIPPA compliant telemedicine application. I verified that I am speaking with Lori Willis  using two identifiers. Lori Eagles, PA-C  was physically located in a [facility] Urgent care site and Lori Willis was located at a different location.   The limitations of evaluation and management by telemedicine as well as the availability of in-person appointments were discussed. Patient was informed that she  may incur a bill ( including co-pay) for this virtual visit encounter. Lori Willis  expressed understanding and gave verbal consent to proceed with virtual visit.     History of Present Illness:Lori Willis  is a 23 y.o. female presents with 1 day history of rash over back of her right thigh. Rash is itchy but not painful. Tried a hydrocortisone cream 1%. Patient does shave over this area but has been a couple of days since she last shaved. Works for [employer], did not go outdoors generally. No outdoor activities, bug/insect stings or bites. Has hx of anaphylaxis with benadryl.  No new exposures including foods, hygiene products, shaving creams.   ROS Denies fever, redness, drainage, pain.   Denies taking any chronic medications.    Allergies  Allergen Reactions  . Benadryl [Diphenhydramine] Anaphylaxis     Past Medical History:  Diagnosis Date  . Anemia   . Anxiety   . Complication of anesthesia    PT STATES SINCE HER EPIDURAL IN SEPT 2018 SHE HAS HAD LEFT ARM TINGLING/NUMBNESS DOWN ENTIRE ARM  . GERD (gastroesophageal reflux disease)    NO MEDS  . Headache    MIGRAINES  . Panic attack   . PTSD (post-traumatic stress disorder)   . Scoliosis     Past Surgical History:  Procedure Laterality Date  . HEMORROIDECTOMY     . LAPAROSCOPIC TUBAL LIGATION Bilateral 09/03/2018   Procedure: LAPAROSCOPIC BILATERAL TUBAL LIGATION VIA FILSHIE CLIPS;  Surgeon: Rubie Maid, MD;  Location: [facility] ORS;  Service: Gynecology;  Laterality: Bilateral;      Observations/Objective: Physical Exam Constitutional:      General: She is not in acute distress.    Appearance: Normal appearance. She is well-developed. She is not ill-appearing, toxic-appearing or diaphoretic.  Eyes:     Extraocular Movements: Extraocular movements intact.  Pulmonary:     Effort: Pulmonary effort is normal.  Skin:      Neurological:     General: No focal deficit present.     Mental Status: She is alert and oriented to person, place, and time.  Psychiatric:        Mood and Affect: Mood normal.        Behavior: Behavior normal.        Thought Content: Thought content normal.        Judgment: Judgment normal.      Assessment and Plan:  1. Rash and nonspecific skin eruption   2. Itching     Will cover with betamethasone cream for an irritant/contact dermatitis to unknown offending agent. Counseled patient on potential for adverse effects with medications prescribed/recommended today, ER and return-to-clinic precautions discussed, patient verbalized understanding.   Follow Up Instructions:    I discussed the assessment and treatment plan with the patient. The patient was provided an opportunity to ask questions and all were answered. The patient agreed  with the plan and demonstrated an understanding of the instructions.   The patient was advised to call back or seek an in-person evaluation if the symptoms worsen or if the condition fails to improve as anticipated.  I provided 10 minutes of non-face-to-face time during this encounter.    Wallis BambergMario Deonta Bomberger, PA-C  06/01/2019 9:27 AM         Wallis BambergMani, Nasiir Monts, PA-C 06/01/19 [phone]

## 2019-07-19 ENCOUNTER — Encounter: Payer: Self-pay | Admitting: Emergency Medicine

## 2019-07-19 ENCOUNTER — Other Ambulatory Visit: Payer: Self-pay

## 2019-07-19 ENCOUNTER — Ambulatory Visit
Admission: EM | Admit: 2019-07-19 | Discharge: 2019-07-19 | Disposition: A | Discharge status: Home / Self Care | Payer: Self-pay | Attending: Family Medicine | Admitting: Family Medicine

## 2019-07-19 DIAGNOSIS — R21 Rash and other nonspecific skin eruption: Secondary | ICD-10-CM

## 2019-07-19 MED ORDER — HYDROXYZINE HCL 25 MG PO TABS: 25.0000 mg | ORAL_TABLET | Freq: Three times a day (TID) | ORAL | 0 refills | Status: DC | PRN

## 2019-07-19 MED ORDER — PREDNISONE 10 MG PO TABS: ORAL_TABLET | ORAL | 0 refills | Status: DC

## 2019-07-19 NOTE — ED Triage Notes (Signed)
Patient c/o rash to legs, abdomen and arms that started 4-5 days ago. Patient tried a prescription cream (Betamethasone) she was given before for a rash and this has not helped.

## 2019-07-19 NOTE — Discharge Instructions (Signed)
Medications as prescribed. ° °Take care ° °Dr. Rhys Lichty  °
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
## 2019-09-05 ENCOUNTER — Other Ambulatory Visit: Payer: Self-pay

## 2019-09-05 ENCOUNTER — Ambulatory Visit
Admission: EM | Admit: 2019-09-05 | Discharge: 2019-09-05 | Disposition: A | Discharge status: Home / Self Care | Payer: Self-pay | Attending: Family Medicine | Admitting: Family Medicine

## 2019-09-05 ENCOUNTER — Encounter: Payer: Self-pay | Admitting: Emergency Medicine

## 2019-09-05 DIAGNOSIS — G43009 Migraine without aura, not intractable, without status migrainosus: Secondary | ICD-10-CM

## 2019-09-05 MED ORDER — KETOROLAC TROMETHAMINE 60 MG/2ML IM SOLN
60.0000 mg | Freq: Once | INTRAMUSCULAR | Status: AC
  Administered 2019-09-05: 60 mg via INTRAMUSCULAR

## 2019-09-05 MED ORDER — HYDROCODONE-ACETAMINOPHEN 5-325 MG PO TABS: ORAL_TABLET | ORAL | 0 refills | Status: DC

## 2019-09-05 MED ORDER — ONDANSETRON 8 MG PO TBDP
8.0000 mg | ORAL_TABLET | Freq: Once | ORAL | Status: AC
  Administered 2019-09-05: 8 mg via ORAL

## 2019-09-05 NOTE — ED Triage Notes (Addendum)
Patient in today c/o migraine since 5am this morning. Patient has taken Tylenol and OTC nausea medication this morning without relief. Patient has a history of migraines.
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
## 2019-09-24 ENCOUNTER — Other Ambulatory Visit: Payer: Self-pay

## 2019-09-24 ENCOUNTER — Ambulatory Visit
Admission: EM | Admit: 2019-09-24 | Discharge: 2019-09-24 | Disposition: A | Discharge status: Home / Self Care | Payer: Self-pay | Attending: Family Medicine | Admitting: Family Medicine

## 2019-09-24 DIAGNOSIS — G43709 Chronic migraine without aura, not intractable, without status migrainosus: Secondary | ICD-10-CM

## 2019-09-24 MED ORDER — KETOROLAC TROMETHAMINE 60 MG/2ML IM SOLN
60.0000 mg | Freq: Once | INTRAMUSCULAR | Status: AC
  Administered 2019-09-24: 60 mg via INTRAMUSCULAR

## 2019-09-24 MED ORDER — SUMATRIPTAN SUCCINATE 50 MG PO TABS: 50.0000 mg | ORAL_TABLET | Freq: Once | ORAL | 0 refills | Status: DC

## 2019-09-24 MED ORDER — ONDANSETRON 8 MG PO TBDP
8.0000 mg | ORAL_TABLET | Freq: Once | ORAL | Status: AC
  Administered 2019-09-24: 15:00:00 8 mg via ORAL

## 2019-09-24 NOTE — ED Triage Notes (Signed)
Patient complains of migraine that started last night. States that she took BCs and ibuprofen without relief.
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
## 2019-10-15 ENCOUNTER — Other Ambulatory Visit: Payer: Self-pay

## 2019-10-15 ENCOUNTER — Encounter: Payer: Self-pay | Admitting: Emergency Medicine

## 2019-10-15 ENCOUNTER — Ambulatory Visit
Admission: EM | Admit: 2019-10-15 | Discharge: 2019-10-15 | Disposition: A | Discharge status: Home / Self Care | Payer: Self-pay | Attending: Urgent Care | Admitting: Urgent Care

## 2019-10-15 DIAGNOSIS — G43909 Migraine, unspecified, not intractable, without status migrainosus: Secondary | ICD-10-CM

## 2019-10-15 DIAGNOSIS — Z760 Encounter for issue of repeat prescription: Secondary | ICD-10-CM

## 2019-10-15 MED ORDER — SUMATRIPTAN SUCCINATE 50 MG PO TABS: 50.0000 mg | ORAL_TABLET | ORAL | 0 refills | Status: DC

## 2019-10-15 MED ORDER — ALBUTEROL SULFATE HFA 108 (90 BASE) MCG/ACT IN AERS: 2.0000 | INHALATION_SPRAY | Freq: Four times a day (QID) | RESPIRATORY_TRACT | 0 refills | Status: DC | PRN

## 2019-10-15 MED ORDER — DEXAMETHASONE SODIUM PHOSPHATE 10 MG/ML IJ SOLN
10.0000 mg | Freq: Once | INTRAMUSCULAR | Status: AC
  Administered 2019-10-15: 10 mg via INTRAMUSCULAR

## 2019-10-15 MED ORDER — KETOROLAC TROMETHAMINE 60 MG/2ML IM SOLN
60.0000 mg | Freq: Once | INTRAMUSCULAR | Status: AC
  Administered 2019-10-15: 12:00:00 60 mg via INTRAMUSCULAR

## 2019-10-15 NOTE — ED Triage Notes (Signed)
Patient c/o migraine headache that started 3 days ago.  Patient has been unable to get into see her PCP.  Patient states that she took Imitrex on the first day but the HA came back.  Patient states that she is out of Imitrex.  Patient denies cold symptoms or fevers.

## 2019-10-15 NOTE — Discharge Instructions (Signed)
It was very nice seeing you today in clinic. Thank you for entrusting me with your care.   Rest and allow medications to work. You were treated in the clinic today with Toradol and Dexamethasone, which will continue to work for the next several hours. Will refill you Imitrex and Albuterol per your request.   Make arrangements to follow up with your regular doctor in 1 week for re-evaluation if not improving. If your symptoms/condition worsens, please seek follow up care either here or in the ER. Please remember, our [facility] providers are "right here with you" when you need [place].   Again, it was my pleasure to take care of you today. Thank you for choosing our clinic. I hope that you start to feel better quickly.   Honor Loh, MSN, APRN, FNP-C, CEN Advanced Practice Provider [facility]
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
## 2019-11-17 ENCOUNTER — Other Ambulatory Visit: Payer: Self-pay

## 2019-11-17 ENCOUNTER — Telehealth: Payer: Self-pay | Admitting: Obstetrics and Gynecology

## 2019-11-17 ENCOUNTER — Ambulatory Visit
Admission: EM | Admit: 2019-11-17 | Discharge: 2019-11-17 | Disposition: A | Discharge status: Home / Self Care | Payer: Self-pay | Attending: Urgent Care | Admitting: Urgent Care

## 2019-11-17 ENCOUNTER — Encounter: Payer: Self-pay | Admitting: Emergency Medicine

## 2019-11-17 DIAGNOSIS — Z760 Encounter for issue of repeat prescription: Secondary | ICD-10-CM

## 2019-11-17 DIAGNOSIS — R102 Pelvic and perineal pain: Secondary | ICD-10-CM

## 2019-11-17 DIAGNOSIS — M542 Cervicalgia: Secondary | ICD-10-CM

## 2019-11-17 MED ORDER — SUMATRIPTAN SUCCINATE 50 MG PO TABS: 50.0000 mg | ORAL_TABLET | ORAL | 0 refills | Status: DC

## 2019-11-17 MED ORDER — MELOXICAM 15 MG PO TABS: 15.0000 mg | ORAL_TABLET | Freq: Every day | ORAL | 0 refills | Status: DC

## 2019-11-17 NOTE — Discharge Instructions (Addendum)
It was very nice seeing you today in clinic. Thank you for entrusting me with your care.   Rest and use moist heat on your neck/back. Try the Mobic. I think you would benefit from seeing orthopedics for further evaluation given the chronicity of your pain.   I have made you an appointment with Dr. Valentino Saxon next week to discuss you pelvic pain. If if worsens before then, you will need to go to the ED.   If your symptoms/condition worsens, please seek follow up care either here or in the ER. Please remember, our [facility] providers are "right here with you" when you need [place].   Again, it was my pleasure to take care of you today. Thank you for choosing our clinic. I hope that you start to feel better quickly.   Quentin Mulling, MSN, APRN, FNP-C, CEN Advanced Practice Provider [facility]

## 2019-11-17 NOTE — ED Triage Notes (Signed)
Patient in today c/o LLQ abdominal pain since yesterday. Patient states it was worse when she had a bowel movement.  Patient also requesting a refill on migraine medication.

## 2019-11-17 NOTE — Telephone Encounter (Signed)
error 
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
## 2019-11-22 ENCOUNTER — Encounter: Payer: Self-pay | Admitting: Obstetrics and Gynecology

## 2019-12-05 NOTE — Progress Notes (Deleted)
Pt present for ED follow up for pelvic pain. Pt stated having the pelvic pain x

## 2019-12-06 ENCOUNTER — Ambulatory Visit: Payer: Self-pay | Admitting: Obstetrics and Gynecology

## 2019-12-10 ENCOUNTER — Other Ambulatory Visit: Payer: Self-pay

## 2019-12-10 ENCOUNTER — Encounter: Payer: Self-pay | Admitting: Emergency Medicine

## 2019-12-10 ENCOUNTER — Ambulatory Visit
Admission: EM | Admit: 2019-12-10 | Discharge: 2019-12-10 | Disposition: A | Discharge status: Home / Self Care | Payer: Self-pay | Attending: Internal Medicine | Admitting: Internal Medicine

## 2019-12-10 DIAGNOSIS — G43019 Migraine without aura, intractable, without status migrainosus: Secondary | ICD-10-CM

## 2019-12-10 MED ORDER — METOCLOPRAMIDE HCL 5 MG/ML IJ SOLN: 10.0000 mg | Freq: Once | INTRAMUSCULAR | Status: DC

## 2019-12-10 MED ORDER — SUMATRIPTAN SUCCINATE 6 MG/0.5ML ~~LOC~~ SOLN
6.0000 mg | Freq: Once | SUBCUTANEOUS | Status: AC
  Administered 2019-12-10: 10:00:00 6 mg via SUBCUTANEOUS

## 2019-12-10 MED ORDER — SUMATRIPTAN SUCCINATE 50 MG PO TABS: 50.0000 mg | ORAL_TABLET | ORAL | 0 refills | Status: DC

## 2019-12-10 MED ORDER — DEXAMETHASONE SODIUM PHOSPHATE 10 MG/ML IJ SOLN
10.0000 mg | Freq: Once | INTRAMUSCULAR | Status: AC
  Administered 2019-12-10: 10 mg via INTRAMUSCULAR

## 2019-12-10 MED ORDER — METOCLOPRAMIDE HCL 5 MG/ML IJ SOLN
10.0000 mg | Freq: Once | INTRAMUSCULAR | Status: AC
  Administered 2019-12-10: 10:00:00 10 mg via INTRAMUSCULAR
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
## 2019-12-10 NOTE — ED Triage Notes (Signed)
Pt c/i headache that she stays is migraine. She states it started last night. She has h/o migraines. She also c/o nausea. Denies fever or body aches. She states she took her Imitrex yesterday and it did not help.

## 2020-03-07 ENCOUNTER — Other Ambulatory Visit: Payer: Self-pay

## 2020-03-07 ENCOUNTER — Ambulatory Visit: Admission: EM | Admit: 2020-03-07 | Discharge: 2020-03-07 | Discharge status: Against Medical Advice | Payer: Self-pay

## 2020-04-09 ENCOUNTER — Ambulatory Visit
Admission: EM | Admit: 2020-04-09 | Discharge: 2020-04-09 | Disposition: A | Discharge status: Home / Self Care | Payer: Self-pay | Attending: Internal Medicine | Admitting: Internal Medicine

## 2020-04-09 ENCOUNTER — Encounter: Payer: Self-pay | Admitting: Emergency Medicine

## 2020-04-09 ENCOUNTER — Other Ambulatory Visit: Payer: Self-pay

## 2020-04-09 DIAGNOSIS — G43909 Migraine, unspecified, not intractable, without status migrainosus: Secondary | ICD-10-CM

## 2020-04-09 MED ORDER — METOCLOPRAMIDE HCL 5 MG/ML IJ SOLN
10.0000 mg | Freq: Once | INTRAMUSCULAR | Status: AC
  Administered 2020-04-09: 10 mg via INTRAMUSCULAR

## 2020-04-09 MED ORDER — ONDANSETRON HCL 4 MG PO TABS: 4.0000 mg | ORAL_TABLET | Freq: Four times a day (QID) | ORAL | 0 refills | Status: DC

## 2020-04-09 MED ORDER — SUMATRIPTAN SUCCINATE 6 MG/0.5ML ~~LOC~~ SOLN
6.0000 mg | Freq: Once | SUBCUTANEOUS | Status: AC
  Administered 2020-04-09: 6 mg via SUBCUTANEOUS

## 2020-04-09 MED ORDER — SUMATRIPTAN SUCCINATE 50 MG PO TABS: 50.0000 mg | ORAL_TABLET | ORAL | 2 refills | Status: DC

## 2020-04-09 MED ORDER — DEXAMETHASONE SODIUM PHOSPHATE 10 MG/ML IJ SOLN
10.0000 mg | Freq: Once | INTRAMUSCULAR | Status: AC
  Administered 2020-04-09: 10 mg via INTRAMUSCULAR
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
## 2020-04-09 NOTE — ED Triage Notes (Signed)
Pt c/o migraine. Started yesterday morning. She has a h/o migraines. She has had nausea and vomiting. She is out of her sumatriptan and has not used it for this migraine.

## 2020-07-01 ENCOUNTER — Other Ambulatory Visit: Payer: Self-pay

## 2020-07-01 ENCOUNTER — Ambulatory Visit
Admission: EM | Admit: 2020-07-01 | Discharge: 2020-07-01 | Disposition: A | Discharge status: Home / Self Care | Payer: Self-pay | Attending: Emergency Medicine | Admitting: Emergency Medicine

## 2020-07-01 DIAGNOSIS — G43909 Migraine, unspecified, not intractable, without status migrainosus: Secondary | ICD-10-CM

## 2020-07-01 MED ORDER — SUMATRIPTAN SUCCINATE 6 MG/0.5ML ~~LOC~~ SOLN
6.0000 mg | Freq: Once | SUBCUTANEOUS | Status: AC
  Administered 2020-07-01: 6 mg via SUBCUTANEOUS

## 2020-07-01 MED ORDER — METOCLOPRAMIDE HCL 5 MG/ML IJ SOLN
10.0000 mg | Freq: Once | INTRAMUSCULAR | Status: AC
  Administered 2020-07-01: 10 mg via INTRAMUSCULAR

## 2020-07-01 MED ORDER — DEXAMETHASONE SODIUM PHOSPHATE 10 MG/ML IJ SOLN
10.0000 mg | Freq: Once | INTRAMUSCULAR | Status: AC
  Administered 2020-07-01: 10 mg via INTRAMUSCULAR
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
## 2020-07-01 NOTE — ED Triage Notes (Signed)
Patient complains of a migraine that started yesterday and has been constant. States that she has taken ibuprofen, naproxen and tylenol without relief. States that she request a refill of sumatriptan from perscriber and they sent it to the pharmacy which is closed today.

## 2020-07-01 NOTE — Discharge Instructions (Signed)
You were given injections of Decadron, Reglan, and Imitrex today.    Follow-up with your primary care provider as needed.

## 2020-08-17 ENCOUNTER — Ambulatory Visit
Admission: EM | Admit: 2020-08-17 | Discharge: 2020-08-17 | Disposition: A | Discharge status: Home / Self Care | Payer: Self-pay | Attending: Emergency Medicine | Admitting: Emergency Medicine

## 2020-08-17 ENCOUNTER — Other Ambulatory Visit: Payer: Self-pay

## 2020-08-17 ENCOUNTER — Encounter: Payer: Self-pay | Admitting: Emergency Medicine

## 2020-08-17 DIAGNOSIS — G43809 Other migraine, not intractable, without status migrainosus: Secondary | ICD-10-CM

## 2020-08-17 MED ORDER — METOCLOPRAMIDE HCL 5 MG/ML IJ SOLN
5.0000 mg | Freq: Once | INTRAMUSCULAR | Status: AC
  Administered 2020-08-17: 5 mg via INTRAMUSCULAR

## 2020-08-17 MED ORDER — SUMATRIPTAN SUCCINATE 50 MG PO TABS: 50.0000 mg | ORAL_TABLET | ORAL | 0 refills | Status: DC

## 2020-08-17 MED ORDER — DEXAMETHASONE SODIUM PHOSPHATE 10 MG/ML IJ SOLN
10.0000 mg | Freq: Once | INTRAMUSCULAR | Status: AC
  Administered 2020-08-17: 10 mg via INTRAMUSCULAR

## 2020-08-17 MED ORDER — SUMATRIPTAN SUCCINATE 6 MG/0.5ML ~~LOC~~ SOLN
6.0000 mg | Freq: Once | SUBCUTANEOUS | Status: AC
  Administered 2020-08-17: 6 mg via SUBCUTANEOUS
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
## 2020-08-17 NOTE — Discharge Instructions (Signed)
May take Benadryl as well to promote sleep and help with headache.  Go home, rest in quiet dark room. Limit screen time.  Drink plenty of water to ensure adequate hydration.

## 2020-08-17 NOTE — ED Triage Notes (Signed)
Patient c/o migraine headache that started last night.  Patient states that she has taken Imitrex for her headache.  Patient states that she tends to get a migraine headache at the end of her menstrual period.

## 2020-08-28 ENCOUNTER — Encounter: Payer: Self-pay | Admitting: Emergency Medicine

## 2020-08-28 ENCOUNTER — Ambulatory Visit
Admission: EM | Admit: 2020-08-28 | Discharge: 2020-08-28 | Disposition: A | Discharge status: Home / Self Care | Payer: Self-pay | Attending: Family Medicine | Admitting: Family Medicine

## 2020-08-28 ENCOUNTER — Other Ambulatory Visit: Payer: Self-pay

## 2020-08-28 DIAGNOSIS — R42 Dizziness and giddiness: Secondary | ICD-10-CM | POA: Insufficient documentation

## 2020-08-28 LAB — BASIC METABOLIC PANEL
Anion gap: 7 (ref 5–15)
BUN: 12 mg/dL (ref 6–20)
CO2: 29 mmol/L (ref 22–32)
Calcium: 9.6 mg/dL (ref 8.9–10.3)
Chloride: 105 mmol/L (ref 98–111)
Creatinine, Ser: 0.69 mg/dL (ref 0.44–1.00)
GFR, Estimated: >60 mL/min (ref >60)
Glucose, Bld: 111 mg/dL — ABNORMAL HIGH (ref 70–99)
Potassium: 3.9 mmol/L (ref 3.5–5.1)
Sodium: 141 mmol/L (ref 135–145)

## 2020-08-28 LAB — CBC WITH DIFFERENTIAL/PLATELET
Abs Immature Granulocytes: 0.03 10*3/uL (ref 0.00–0.07)
Basophils Absolute: 0 10*3/uL (ref 0.0–0.1)
Basophils Relative: 1 %
Eosinophils Absolute: 0.1 10*3/uL (ref 0.0–0.5)
Eosinophils Relative: 2 %
HCT: 39.6 % (ref 36.0–46.0)
Hemoglobin: 12.6 g/dL (ref 12.0–15.0)
Immature Granulocytes: 0 %
Lymphocytes Relative: 30 %
Lymphs Abs: 2.3 10*3/uL (ref 0.7–4.0)
MCH: 28.2 pg (ref 26.0–34.0)
MCHC: 31.8 g/dL (ref 30.0–36.0)
MCV: 88.6 fL (ref 80.0–100.0)
Monocytes Absolute: 0.6 10*3/uL (ref 0.1–1.0)
Monocytes Relative: 8 %
Neutro Abs: 4.6 10*3/uL (ref 1.7–7.7)
Neutrophils Relative %: 59 %
Platelets: 253 10*3/uL (ref 150–400)
RBC: 4.47 MIL/uL (ref 3.87–5.11)
RDW: 14.2 % (ref 11.5–15.5)
WBC: 7.7 10*3/uL (ref 4.0–10.5)
nRBC: 0 % (ref 0.0–0.2)

## 2020-08-28 MED ORDER — MECLIZINE HCL 25 MG PO TABS: 25.0000 mg | ORAL_TABLET | Freq: Three times a day (TID) | ORAL | 0 refills | Status: DC | PRN

## 2020-08-28 NOTE — ED Triage Notes (Signed)
Pt c/o dizziness, shortness of breath, nausea and vomiting. She states it has been intermittent for the last week. She has also had headaches intermittently for the last week.
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
## 2020-08-28 NOTE — Discharge Instructions (Addendum)
Labs normal.  Medication as needed.  Rest and lots of fluids.   Take care  Dr. Adriana Simas

## 2020-10-13 ENCOUNTER — Ambulatory Visit
Admission: EM | Admit: 2020-10-13 | Discharge: 2020-10-13 | Disposition: A | Discharge status: Home / Self Care | Payer: Self-pay | Attending: Physician Assistant | Admitting: Physician Assistant

## 2020-10-13 ENCOUNTER — Encounter: Payer: Self-pay | Admitting: Emergency Medicine

## 2020-10-13 ENCOUNTER — Other Ambulatory Visit: Payer: Self-pay

## 2020-10-13 DIAGNOSIS — G43909 Migraine, unspecified, not intractable, without status migrainosus: Secondary | ICD-10-CM

## 2020-10-13 DIAGNOSIS — R112 Nausea with vomiting, unspecified: Secondary | ICD-10-CM

## 2020-10-13 DIAGNOSIS — H53149 Visual discomfort, unspecified: Secondary | ICD-10-CM

## 2020-10-13 MED ORDER — ONDANSETRON 4 MG PO TBDP: 4.0000 mg | ORAL_TABLET | Freq: Three times a day (TID) | ORAL | 0 refills | Status: AC | PRN

## 2020-10-13 MED ORDER — SUMATRIPTAN SUCCINATE 6 MG/0.5ML ~~LOC~~ SOLN
6.0000 mg | Freq: Once | SUBCUTANEOUS | Status: AC
  Administered 2020-10-13: 6 mg via SUBCUTANEOUS

## 2020-10-13 MED ORDER — METOCLOPRAMIDE HCL 5 MG/ML IJ SOLN
5.0000 mg | Freq: Once | INTRAMUSCULAR | Status: AC
  Administered 2020-10-13: 5 mg via INTRAMUSCULAR

## 2020-10-13 MED ORDER — SUMATRIPTAN SUCCINATE 50 MG PO TABS: 50.0000 mg | ORAL_TABLET | ORAL | 0 refills | Status: DC

## 2020-10-13 MED ORDER — DEXAMETHASONE SODIUM PHOSPHATE 10 MG/ML IJ SOLN
10.0000 mg | Freq: Once | INTRAMUSCULAR | Status: AC
  Administered 2020-10-13: 10 mg via INTRAMUSCULAR
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
## 2020-10-13 NOTE — Discharge Instructions (Addendum)
HEADACHE: You were seen in clinic today for headache. Rest and take meds as directed. If at any point, the headache becomes very severe, is associated with fever, is associated with neck pain/stiffness, you feel like passing out, the headache is different from any you've have had before, there are vision changes/issues with speech/issues with balance, or numbness/weakness in a part of the body, you should be seen urgently or emergently for more serious causes of headache  °

## 2020-10-13 NOTE — ED Triage Notes (Signed)
Patient c/o headache, neck pain, N&V that started last night.  Patient denies fevers.

## 2020-10-30 ENCOUNTER — Ambulatory Visit
Admission: EM | Admit: 2020-10-30 | Discharge: 2020-10-30 | Disposition: A | Discharge status: Home / Self Care | Payer: Self-pay

## 2020-10-30 ENCOUNTER — Other Ambulatory Visit: Payer: Self-pay

## 2020-10-30 ENCOUNTER — Ambulatory Visit
Admission: EM | Admit: 2020-10-30 | Discharge: 2020-10-30 | Disposition: A | Discharge status: Home / Self Care | Payer: Self-pay | Attending: Family Medicine | Admitting: Family Medicine

## 2020-10-30 DIAGNOSIS — G43809 Other migraine, not intractable, without status migrainosus: Secondary | ICD-10-CM

## 2020-10-30 MED ORDER — DEXAMETHASONE SODIUM PHOSPHATE 10 MG/ML IJ SOLN
10.0000 mg | Freq: Once | INTRAMUSCULAR | Status: AC
  Administered 2020-10-30: 10 mg via INTRAMUSCULAR

## 2020-10-30 MED ORDER — SUMATRIPTAN SUCCINATE 50 MG PO TABS: 50.0000 mg | ORAL_TABLET | ORAL | 0 refills | Status: DC

## 2020-10-30 MED ORDER — KETOROLAC TROMETHAMINE 30 MG/ML IJ SOLN
30.0000 mg | Freq: Once | INTRAMUSCULAR | Status: AC
  Administered 2020-10-30: 30 mg via INTRAMUSCULAR

## 2020-10-30 NOTE — ED Triage Notes (Signed)
Pt presents with migraine x 3 days.  + photo and auditory sensitivity.  H/o migraines.   Used to take Imitrex at home given to her by [facility] but she is out now.  Does not have a PCP.

## 2020-10-30 NOTE — Discharge Instructions (Signed)
Toradol and Decadron given here for headache. I have sent Imitrex refill to your pharmacy I have put contact on your discharge instructions for primary care and OB/GYN.  These places both take people without insurance.  This is part of the current system. Please follow-up with them
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
## 2020-12-05 ENCOUNTER — Telehealth: Payer: Self-pay

## 2020-12-05 NOTE — Telephone Encounter (Signed)
Pt called in and is requesting a call back. 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 I told the pt I will send a message and a nurse will be in touch with her. The pt verbally understood. Please advise

## 2020-12-07 ENCOUNTER — Encounter: Payer: Self-pay | Admitting: Certified Nurse Midwife

## 2020-12-07 NOTE — Telephone Encounter (Signed)
Patient called no answer. Was calling to get more information.

## 2020-12-07 NOTE — Telephone Encounter (Signed)
Is this something you will do? Please advise. Thanks [employer]

## 2020-12-09 ENCOUNTER — Ambulatory Visit
Admission: EM | Admit: 2020-12-09 | Discharge: 2020-12-09 | Disposition: A | Discharge status: Home / Self Care | Payer: Self-pay | Attending: Physician Assistant | Admitting: Physician Assistant

## 2020-12-09 ENCOUNTER — Encounter: Payer: Self-pay | Admitting: Emergency Medicine

## 2020-12-09 ENCOUNTER — Other Ambulatory Visit: Payer: Self-pay

## 2020-12-09 DIAGNOSIS — G43909 Migraine, unspecified, not intractable, without status migrainosus: Secondary | ICD-10-CM

## 2020-12-09 MED ORDER — METOCLOPRAMIDE HCL 5 MG/ML IJ SOLN: 5.0000 mg | Freq: Once | INTRAMUSCULAR | Status: DC

## 2020-12-09 MED ORDER — SUMATRIPTAN SUCCINATE 50 MG PO TABS: ORAL_TABLET | ORAL | 0 refills | Status: DC

## 2020-12-09 MED ORDER — SUMATRIPTAN SUCCINATE 6 MG/0.5ML ~~LOC~~ SOLN
6.0000 mg | Freq: Once | SUBCUTANEOUS | Status: AC
  Administered 2020-12-09: 6 mg via SUBCUTANEOUS

## 2020-12-09 MED ORDER — DEXAMETHASONE SODIUM PHOSPHATE 10 MG/ML IJ SOLN
10.0000 mg | Freq: Once | INTRAMUSCULAR | Status: AC
  Administered 2020-12-09: 10 mg via INTRAMUSCULAR

## 2020-12-09 NOTE — Discharge Instructions (Addendum)
HEADACHE: You were seen in clinic today for headache. Rest and take meds as directed. If at any point, the headache becomes very severe, is associated with fever, is associated with neck pain/stiffness, you feel like passing out, the headache is different from any you've have had before, there are vision changes/issues with speech/issues with balance, or numbness/weakness in a part of the body, you should be seen urgently or emergently for more serious causes of headache  Please contact a PCP about your migraines. You need follow up on this and possible referral to neurology. We have seen you many times for migraines and you should be following up with a PCP about this. Please contact office below for appointment.   [facility] at [facility], [address], [address]. Phone number: [phone] Please call this number to establish with primary care
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
## 2020-12-09 NOTE — ED Triage Notes (Signed)
Pt c/o migraine. Started yesterday morning. She states she is out of her imitrex. She has h/o migraines.

## 2020-12-10 NOTE — Telephone Encounter (Signed)
Spoke to pt concerning her call to the office and scheduled pt for an annual exam/sergeant.

## 2020-12-27 ENCOUNTER — Other Ambulatory Visit: Payer: Self-pay

## 2021-01-01 ENCOUNTER — Ambulatory Visit (INDEPENDENT_AMBULATORY_CARE_PROVIDER_SITE_OTHER): Payer: Self-pay | Admitting: Obstetrics and Gynecology

## 2021-01-01 ENCOUNTER — Other Ambulatory Visit: Payer: Self-pay

## 2021-01-01 ENCOUNTER — Encounter: Payer: Self-pay | Admitting: Obstetrics and Gynecology

## 2021-01-01 ENCOUNTER — Other Ambulatory Visit (HOSPITAL_COMMUNITY)
Admission: RE | Admit: 2021-01-01 | Discharge: 2021-01-01 | Disposition: A | Discharge status: Home / Self Care | Payer: Self-pay | Source: Ambulatory Visit | Attending: Obstetrics and Gynecology | Admitting: Obstetrics and Gynecology

## 2021-01-01 VITALS — BP 112/78 | HR 83 | Ht 63.0 in | Wt 161.3 lb

## 2021-01-01 DIAGNOSIS — E663 Overweight: Secondary | ICD-10-CM

## 2021-01-01 DIAGNOSIS — Z01419 Encounter for gynecological examination (general) (routine) without abnormal findings: Secondary | ICD-10-CM

## 2021-01-01 DIAGNOSIS — Z317 Encounter for procreative management and counseling for gestational carrier: Secondary | ICD-10-CM

## 2021-01-01 DIAGNOSIS — Z124 Encounter for screening for malignant neoplasm of cervix: Secondary | ICD-10-CM

## 2021-01-01 NOTE — Progress Notes (Signed)
Annual exam-pt stated that she was doing well.  

## 2021-01-01 NOTE — Progress Notes (Signed)
GYNECOLOGY ANNUAL PHYSICAL EXAM PROGRESS NOTE  Subjective:    Lori Willis is a 25 y.o. [phone] female who presents for an annual exam. The patient is sexually active.  The patient participates in regular exercise: yes. She was last seen in 2019.   Lori Willis desires to note the following today:  1.  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  Desires to have full check up today.  2. She also reports difficulties in losing weight. Has cut out junk food and is engaging in exercise but still is not seeing noticeable results.    Menstrual History: Menarche age: 26 or 7 Patient's last menstrual period was 12/27/2020. Period Duration (Days): 6 Period Pattern: Regular Menstrual Flow: Heavy,Moderate,Light Menstrual Control: Tampon,Maxi pad,Panty liner Menstrual Control Change Freq (Hours): 3-6 Dysmenorrhea: (!) Moderate Dysmenorrhea Symptoms: Cramping,Headache    Gynecologic History Contraception: tubal ligation History of STI's: Denies  Last Pap: 12/30/2017. Results were: normal.  Denies h/o abnormal pap smears.   OB History  Gravida Para Term Preterm AB Living  4 3 3  0 1 3  SAB IAB Ectopic Multiple Live Births  0 1 0 0 3    # Outcome Date GA Lbr Len/2nd Weight Sex Delivery Anes PTL Lv  4 Term 07/17/18 [redacted]w[redacted]d 07:20 / 00:15 6 lb 11.2 oz (3.04 kg) M Vag-Spont EPI  LIV     Name: Lori Willis,BOY Lori Willis     Apgar1: 8  Apgar5: 9  3 Term 2017 [redacted]w[redacted]d  6 lb 14 oz (3.118 kg) F Vag-Spont  Y LIV  2 Term 2015 [redacted]w[redacted]d  7 lb 15 oz (3.6 kg) M Vag-Spont  N LIV  1 IAB 2013 [redacted]w[redacted]d           Past Medical History:  Diagnosis Date  . Anemia   . Anxiety   . Complication of anesthesia    PT STATES SINCE HER EPIDURAL IN SEPT 2018 SHE HAS HAD LEFT ARM TINGLING/NUMBNESS DOWN ENTIRE ARM  . GERD (gastroesophageal reflux disease)    NO MEDS  . Headache    MIGRAINES  . Panic attack   . PTSD (post-traumatic stress disorder)   . Scoliosis     Past Surgical History:  Procedure  Laterality Date  . HEMORROIDECTOMY    . LAPAROSCOPIC TUBAL LIGATION Bilateral 09/03/2018   Procedure: LAPAROSCOPIC BILATERAL TUBAL LIGATION VIA FILSHIE CLIPS;  Surgeon: 13/11/2017, MD;  Location: [facility] ORS;  Service: Gynecology;  Laterality: Bilateral;    Family History  Problem Relation Age of Onset  . Hypertension Mother   . Anemia Mother   . Diabetes Mother   . Hypertension Father     Social History   Socioeconomic History  . Marital status: Married    Spouse name: Not on file  . Number of children: Not on file  . Years of education: Not on file  . Highest education level: Not on file  Occupational History  . Not on file  Tobacco Use  . Smoking status: Never Smoker  . Smokeless tobacco: Never Used  Vaping Use  . Vaping Use: Never used  Substance and Sexual Activity  . Alcohol use: Yes    Comment: rarely  . Drug use: No  . Sexual activity: Yes    Birth control/protection: None    Comment: tubal  Other Topics Concern  . Not on file  Social History Narrative  . Not on file   Social Determinants of Health   Financial Resource Strain: Not on file  Food Insecurity:  Not on file  Transportation Needs: Not on file  Physical Activity: Not on file  Stress: Not on file  Social Connections: Not on file  Intimate Partner Violence: Not on file    Current Outpatient Medications on File Prior to Visit  Medication Sig Dispense Refill  . SUMAtriptan (IMITREX) 50 MG tablet Take PO x 1 and may repeat in 2 hours if headache persists or recurs. 10 tablet 0  . [DISCONTINUED] albuterol (VENTOLIN HFA) 108 (90 Base) MCG/ACT inhaler Inhale 2 puffs into the lungs every 6 (six) hours as needed for wheezing or shortness of breath. 18 g 0  . [DISCONTINUED] Calcium-Magnesium-Vitamin D (CALCIUM 1200+D3 PO) Take 1 tablet by mouth every evening.     No current facility-administered medications on file prior to visit.    Allergies  Allergen Reactions  . Benadryl [Diphenhydramine]  Anaphylaxis     Review of Systems Constitutional: negative for chills, fatigue, fevers and sweats Eyes: negative for irritation, redness and visual disturbance Ears, nose, mouth, throat, and face: negative for hearing loss, nasal congestion, snoring and tinnitus Respiratory: negative for asthma, cough, sputum Cardiovascular: negative for chest pain, dyspnea, exertional chest pressure/discomfort, irregular heart beat, palpitations and syncope Gastrointestinal: negative for abdominal pain, change in bowel habits, nausea and vomiting Genitourinary: negative for abnormal menstrual periods, genital lesions, sexual problems and vaginal discharge, dysuria and urinary incontinence Integument/breast: negative for breast lump, breast tenderness and nipple discharge Hematologic/lymphatic: negative for bleeding and easy bruising Musculoskeletal:negative for back pain and muscle weakness Neurological: negative for dizziness, headaches, vertigo and weakness Endocrine: negative for diabetic symptoms including polydipsia, polyuria and skin dryness Allergic/Immunologic: negative for hay fever and urticaria      Objective:  Blood pressure 112/78, pulse 83, height 5\' 3"  (1.6 m), weight 161 lb 4.8 oz (73.2 kg), last menstrual period 12/27/2020. Body mass index is 28.57 kg/m.  General Appearance:    Alert, cooperative, no distress, appears stated age, overweight  Head:    Normocephalic, without obvious abnormality, atraumatic  Eyes:    PERRL, conjunctiva/corneas clear, EOM's intact, both eyes  Ears:    Normal external ear canals, both ears  Nose:   Nares normal, septum midline, mucosa normal, no drainage or sinus tenderness  Throat:   Lips, mucosa, and tongue normal; teeth and gums normal  Neck:   Supple, symmetrical, trachea midline, no adenopathy; thyroid: no enlargement/tenderness/nodules; no carotid bruit or JVD  Back:     Symmetric, no curvature, ROM normal, no CVA tenderness  Lungs:     Clear to  auscultation bilaterally, respirations unlabored  Chest Wall:    No tenderness or deformity   Heart:    Regular rate and rhythm, S1 and S2 normal, no murmur, rub or gallop  Breast Exam:    No tenderness, masses, or nipple abnormality  Abdomen:     Soft, non-tender, bowel sounds active all four quadrants, no masses, no organomegaly.    Genitalia:    Pelvic:external genitalia normal, vagina without lesions, discharge, or tenderness, rectovaginal septum  normal. Cervix normal in appearance, no cervical motion tenderness, no adnexal masses or tenderness.  Uterus normal size, shape, mobile, regular contours, nontender.  Rectal:    Normal external sphincter.  No hemorrhoids appreciated. Internal exam not done.   Extremities:   Extremities normal, atraumatic, no cyanosis or edema  Pulses:   2+ and symmetric all extremities  Skin:   Skin color, texture, turgor normal, no rashes or lesions  Lymph nodes:   Cervical, supraclavicular, and axillary nodes  normal  Neurologic:   CNII-XII intact, normal strength, sensation and reflexes throughout   .  Labs:  Lab Results  Component Value Date   WBC 7.7 08/28/2020   HGB 12.6 08/28/2020   HCT 39.6 08/28/2020   MCV 88.6 08/28/2020   PLT 253 08/28/2020    Lab Results  Component Value Date   CREATININE 0.69 08/28/2020   BUN 12 08/28/2020   NA 141 08/28/2020   K 3.9 08/28/2020   CL 105 08/28/2020   CO2 29 08/28/2020    Lab Results  Component Value Date   ALT 14 12/07/2017   AST 24 12/07/2017   ALKPHOS 59 12/07/2017   BILITOT 0.5 12/07/2017    No results found for: TSH   Assessment:   1. Encounter for well woman exam with routine gynecological exam   2. Pap smear for cervical cancer screening   3. Overweight (BMI 25.0-29.9)   4. Encounter for procreative management and counseling for person acting as gestational surrogate    Plan:    - Blood tests: CBC with diff, Comprehensive metabolic panel, TSH and HgbA1c. - Breast self exam  technique reviewed and patient encouraged to perform self-exam monthly. - Contraception: tubal ligation. - Discussed healthy lifestyle modifications. - Pap smear performed today.  - Flu vaccine declined.  - COVID vaccination status: declines.  - Discussion had on weight management. On further review, patient still in need of improvement of diet (has a lot of carbohydrate/sugar intake by means of [employer] coffees, breads/rice/pasta).  I also discussed options for OTC weight loss supplements, including green tea extract, apple cider vinegar, garcinia cambogia. Also could consider Hydroxycut or Alli.  - Discussed patient's plans for surrogacy. Notes she desires to lose some weight first.  Also notes that she and her husband are putting their home up for sale and plan to move closer to [facility].     Hildred Laser, MD [facility]

## 2021-01-01 NOTE — Patient Instructions (Signed)
Preventive Care 21-27 Years Old, Female Preventive care refers to lifestyle choices and visits with your health care provider that can promote health and wellness. This includes:  A yearly physical exam. This is also called an annual wellness visit.  Regular dental and eye exams.  Immunizations.  Screening for certain conditions.  Healthy lifestyle choices, such as: ? Eating a healthy diet. ? Getting regular exercise. ? Not using drugs or products that contain nicotine and tobacco. ? Limiting alcohol use. What can I expect for my preventive care visit? Physical exam Your health care provider may check your:  Height and weight. These may be used to calculate your BMI (body mass index). BMI is a measurement that tells if you are at a healthy weight.  Heart rate and blood pressure.  Body temperature.  Skin for abnormal spots. Counseling Your health care provider may ask you questions about your:  Past medical problems.  Family's medical history.  Alcohol, tobacco, and drug use.  Emotional well-being.  Home life and relationship well-being.  Sexual activity.  Diet, exercise, and sleep habits.  Work and work environment.  Access to firearms.  Method of birth control.  Menstrual cycle.  Pregnancy history. What immunizations do I need? Vaccines are usually given at various ages, according to a schedule. Your health care provider will recommend vaccines for you based on your age, medical history, and lifestyle or other factors, such as travel or where you work.   What tests do I need? Blood tests  Lipid and cholesterol levels. These may be checked every 5 years starting at age 20.  Hepatitis C test.  Hepatitis B test. Screening  Diabetes screening. This is done by checking your blood sugar (glucose) after you have not eaten for a while (fasting).  STD (sexually transmitted disease) testing, if you are at risk.  BRCA-related cancer screening. This may be  done if you have a family history of breast, ovarian, tubal, or peritoneal cancers.  Pelvic exam and Pap test. This may be done every 3 years starting at age 21. Starting at age 30, this may be done every 5 years if you have a Pap test in combination with an HPV test. Talk with your health care provider about your test results, treatment options, and if necessary, the need for more tests.   Follow these instructions at home: Eating and drinking  Eat a healthy diet that includes fresh fruits and vegetables, whole grains, lean protein, and low-fat dairy products.  Take vitamin and mineral supplements as recommended by your health care provider.  Do not drink alcohol if: ? Your health care provider tells you not to drink. ? You are pregnant, may be pregnant, or are planning to become pregnant.  If you drink alcohol: ? Limit how much you have to 0-1 drink a day. ? Be aware of how much alcohol is in your drink. In the [place] one drink equals one 12 oz bottle of beer (355 mL), one 5 oz glass of wine (148 mL), or one 1 oz glass of hard liquor (44 mL).   Lifestyle  Take daily care of your teeth and gums. Brush your teeth every morning and night with fluoride toothpaste. Floss one time each day.  Stay active. Exercise for at least 30 minutes 5 or more days each week.  Do not use any products that contain nicotine or tobacco, such as cigarettes, e-cigarettes, and chewing tobacco. If you need help quitting, ask your health care provider.  Do not   use drugs.  If you are sexually active, practice safe sex. Use a condom or other form of protection to prevent STIs (sexually transmitted infections).  If you do not wish to become pregnant, use a form of birth control. If you plan to become pregnant, see your health care provider for a prepregnancy visit.  Find healthy ways to cope with stress, such as: ? Meditation, yoga, or listening to music. ? Journaling. ? Talking to a trusted  person. ? Spending time with friends and family. Safety  Always wear your seat belt while driving or riding in a vehicle.  Do not drive: ? If you have been drinking alcohol. Do not ride with someone who has been drinking. ? When you are tired or distracted. ? While texting.  Wear a helmet and other protective equipment during sports activities.  If you have firearms in your house, make sure you follow all gun safety procedures.  Seek help if you have been physically or sexually abused. What's next?  Go to your health care provider once a year for an annual wellness visit.  Ask your health care provider how often you should have your eyes and teeth checked.  Stay up to date on all vaccines. This information is not intended to replace advice given to you by your health care provider. Make sure you discuss any questions you have with your health care provider. Document Revised: 06/17/2020 Document Reviewed: 07/01/2018 Elsevier Patient Education  2021 Elsevier Inc.     Breast Self-Awareness Breast self-awareness means being familiar with how your breasts look and feel. It involves checking your breasts regularly and reporting any changes to your health care provider. Practicing breast self-awareness is important. Sometimes changes may not be harmful (are benign), but sometimes a change in your breasts can be a sign of a serious medical problem. It is important to learn how to do this procedure correctly so that you can catch problems early, when treatment is more likely to be successful. All women should practice breast self-awareness, including women who have had breast implants. What you need:  A mirror.  A well-lit room. How to do a breast self-exam A breast self-exam is one way to learn what is normal for your breasts and whether your breasts are changing. To do a breast self-exam: Look for changes 1. Remove all the clothing above your waist. 2. Stand in front of a mirror  in a room with good lighting. 3. Put your hands on your hips. 4. Push your hands firmly downward. 5. Compare your breasts in the mirror. Look for differences between them (asymmetry), such as: ? Differences in shape. ? Differences in size. ? Puckers, dips, and bumps in one breast and not the other. 6. Look at each breast for changes in the skin, such as: ? Redness. ? Scaly areas. 7. Look for changes in your nipples, such as: ? Discharge. ? Bleeding. ? Dimpling. ? Redness. ? A change in position.   Feel for changes Carefully feel your breasts for lumps and changes. It is best to do this while lying on your back on the floor, and again while sitting or standing in the tub or shower with soapy water on your skin. Feel each breast in the following way: 1. Place the arm on the side of the breast you are examining above your head. 2. Feel your breast with the other hand. 3. Start in the nipple area and make -inch (2 cm) overlapping circles to feel your breast. Use   pads of your three middle fingers to do this. Apply light pressure, then medium pressure, then firm pressure. The light pressure will allow you to feel the tissue closest to the skin. The medium pressure will allow you to feel the tissue that is a little deeper. The firm pressure will allow you to feel the tissue close to the ribs. 4. Continue the overlapping circles, moving downward over the breast until you feel your ribs below your breast. 5. Move one finger-width toward the center of the body. Continue to use the -inch (2 cm) overlapping circles to feel your breast as you move slowly up toward your collarbone. 6. Continue the up-and-down exam using all three pressures until you reach your armpit.   Write down what you find Writing down what you find can help you remember what to discuss with your health care provider. Write down:  What is normal for each breast.  Any changes that you find in each breast, including: ? The kind  of changes you find. ? Any pain or tenderness. ? Size and location of any lumps.  Where you are in your menstrual cycle, if you are still menstruating. General tips and recommendations  Examine your breasts every month.  If you are breastfeeding, the best time to examine your breasts is after a feeding or after using a breast pump.  If you menstruate, the best time to examine your breasts is 5-7 days after your period. Breasts are generally lumpier during menstrual periods, and it may be more difficult to notice changes.  With time and practice, you will become more familiar with the variations in your breasts and more comfortable with the exam. Contact a health care provider if you:  See a change in the shape or size of your breasts or nipples.  See a change in the skin of your breast or nipples, such as a reddened or scaly area.  Have unusual discharge from your nipples.  Find a lump or thick area that was not there before.  Have pain in your breasts.  Have any concerns related to your breast health. Summary  Breast self-awareness includes looking for physical changes in your breasts, as well as feeling for any changes within your breasts.  Breast self-awareness should be performed in front of a mirror in a well-lit room.  You should examine your breasts every month. If you menstruate, the best time to examine your breasts is 5-7 days after your menstrual period.  Let your health care provider know of any changes you notice in your breasts, including changes in size, changes on the skin, pain or tenderness, or unusual fluid from your nipples. This information is not intended to replace advice given to you by your health care provider. Make sure you discuss any questions you have with your health care provider. Document Revised: 06/08/2018 Document Reviewed: 06/08/2018 Elsevier Patient Education  [place].

## 2021-01-02 LAB — COMPREHENSIVE METABOLIC PANEL
ALT: 20 IU/L (ref 0–32)
AST: 24 IU/L (ref 0–40)
Albumin/Globulin Ratio: 1.8 (ref 1.2–2.2)
Albumin: 4.4 g/dL (ref 3.9–5.0)
Alkaline Phosphatase: 69 IU/L (ref 44–121)
BUN/Creatinine Ratio: 16 (ref 9–23)
BUN: 11 mg/dL (ref 6–20)
Bilirubin Total: 0.2 mg/dL (ref 0.0–1.2)
CO2: 23 mmol/L (ref 20–29)
Calcium: 9.8 mg/dL (ref 8.7–10.2)
Chloride: 103 mmol/L (ref 96–106)
Creatinine, Ser: 0.68 mg/dL (ref 0.57–1.00)
Globulin, Total: 2.4 g/dL (ref 1.5–4.5)
Glucose: 85 mg/dL (ref 65–99)
Potassium: 4.1 mmol/L (ref 3.5–5.2)
Sodium: 139 mmol/L (ref 134–144)
Total Protein: 6.8 g/dL (ref 6.0–8.5)
eGFR: 123 mL/min/{1.73_m2} (ref >59)

## 2021-01-02 LAB — CBC
Hematocrit: 39.7 % (ref 34.0–46.6)
Hemoglobin: 12.9 g/dL (ref 11.1–15.9)
MCH: 28.2 pg (ref 26.6–33.0)
MCHC: 32.5 g/dL (ref 31.5–35.7)
MCV: 87 fL (ref 79–97)
Platelets: 276 10*3/uL (ref 150–450)
RBC: 4.57 x10E6/uL (ref 3.77–5.28)
RDW: 13.2 % (ref 11.7–15.4)
WBC: 5.4 10*3/uL (ref 3.4–10.8)

## 2021-01-02 LAB — HEMOGLOBIN A1C
Est. average glucose Bld gHb Est-mCnc: 114 mg/dL
Hgb A1c MFr Bld: 5.6 % (ref 4.8–5.6)

## 2021-01-02 LAB — TSH: TSH: 1.34 u[IU]/mL (ref 0.450–4.500)

## 2021-01-03 ENCOUNTER — Other Ambulatory Visit: Payer: Self-pay

## 2021-01-03 ENCOUNTER — Telehealth: Payer: Self-pay | Admitting: Obstetrics and Gynecology

## 2021-01-03 ENCOUNTER — Ambulatory Visit
Admission: EM | Admit: 2021-01-03 | Discharge: 2021-01-03 | Disposition: A | Discharge status: Home / Self Care | Payer: Self-pay | Attending: Family Medicine | Admitting: Family Medicine

## 2021-01-03 DIAGNOSIS — G43909 Migraine, unspecified, not intractable, without status migrainosus: Secondary | ICD-10-CM

## 2021-01-03 LAB — CYTOLOGY - PAP: Diagnosis: NEGATIVE

## 2021-01-03 MED ORDER — KETOROLAC TROMETHAMINE 30 MG/ML IJ SOLN
30.0000 mg | Freq: Once | INTRAMUSCULAR | Status: AC
  Administered 2021-01-03: 30 mg via INTRAMUSCULAR

## 2021-01-03 MED ORDER — SUMATRIPTAN SUCCINATE 50 MG PO TABS: ORAL_TABLET | ORAL | 1 refills | Status: DC

## 2021-01-03 MED ORDER — DEXAMETHASONE SODIUM PHOSPHATE 10 MG/ML IJ SOLN
10.0000 mg | Freq: Once | INTRAMUSCULAR | Status: AC
  Administered 2021-01-03: 10 mg via INTRAMUSCULAR

## 2021-01-03 MED ORDER — SUMATRIPTAN SUCCINATE 6 MG/0.5ML ~~LOC~~ SOLN
6.0000 mg | Freq: Once | SUBCUTANEOUS | Status: AC
  Administered 2021-01-03: 6 mg via SUBCUTANEOUS

## 2021-01-03 NOTE — Telephone Encounter (Signed)
New message    Patient is looking for pap smear test results.

## 2021-01-03 NOTE — Discharge Instructions (Signed)
Treating you for a migraine here today I have refilled your imitrex
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
## 2021-01-03 NOTE — ED Triage Notes (Signed)
Pt c/o left temporal, parietal and frontal HA with light sensitivity, nausea since yesterday.  States h/o "migraines" and has used oral imitrex, but is out of Rx now.  Naproxen, midol, tylenol and flonase yesterday with only mild improvement  Denies vomiting, cough, sore throat, or other URI symptoms.

## 2021-01-04 NOTE — Telephone Encounter (Signed)
Please see mychart message encounters

## 2021-01-31 ENCOUNTER — Encounter: Payer: Self-pay | Admitting: Emergency Medicine

## 2021-01-31 ENCOUNTER — Other Ambulatory Visit: Payer: Self-pay

## 2021-01-31 ENCOUNTER — Ambulatory Visit
Admission: EM | Admit: 2021-01-31 | Discharge: 2021-01-31 | Disposition: A | Discharge status: Home / Self Care | Payer: Self-pay | Attending: Family Medicine | Admitting: Family Medicine

## 2021-01-31 DIAGNOSIS — B354 Tinea corporis: Secondary | ICD-10-CM

## 2021-01-31 MED ORDER — SUMATRIPTAN SUCCINATE 50 MG PO TABS: ORAL_TABLET | ORAL | 1 refills | Status: DC

## 2021-01-31 MED ORDER — FLUCONAZOLE 200 MG PO TABS: 200.0000 mg | ORAL_TABLET | Freq: Every day | ORAL | 0 refills | Status: AC
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
## 2021-01-31 NOTE — ED Triage Notes (Signed)
REPORTS RASH ON ABDOMEN FOR ONE MONTH.  PATIENT HAS USED OTC RINGWORM MEDICATION , NO IMPROVEMENT

## 2021-01-31 NOTE — Discharge Instructions (Addendum)
Treating for ring worm- 1 tablet a week for 3 weeks.  You can take Zyrtec for itching.  Follow up as needed for continued or worsening symptoms

## 2021-03-04 ENCOUNTER — Other Ambulatory Visit: Payer: Self-pay

## 2021-03-04 ENCOUNTER — Ambulatory Visit
Admission: EM | Admit: 2021-03-04 | Discharge: 2021-03-04 | Disposition: A | Discharge status: Home / Self Care | Payer: Self-pay | Attending: Emergency Medicine | Admitting: Emergency Medicine

## 2021-03-04 DIAGNOSIS — R519 Headache, unspecified: Secondary | ICD-10-CM

## 2021-03-04 DIAGNOSIS — R21 Rash and other nonspecific skin eruption: Secondary | ICD-10-CM

## 2021-03-04 MED ORDER — SUMATRIPTAN SUCCINATE 50 MG PO TABS: 50.0000 mg | ORAL_TABLET | ORAL | 0 refills | Status: DC | PRN

## 2021-03-04 MED ORDER — TRIAMCINOLONE ACETONIDE 0.1 % EX CREA: 1.0000 "application " | TOPICAL_CREAM | Freq: Two times a day (BID) | CUTANEOUS | 0 refills | Status: DC

## 2021-03-04 NOTE — Discharge Instructions (Signed)
Take the Imitrex as directed.    Establish a primary care provider soon as possible.  Assistance has been requested from [facility] health.  Schedule an appointment with a neurologist as soon as possible for your recurrent headaches.    Apply the triamcinolone cream to your rash as directed.  Follow-up with a dermatologist if your rash is not improving.
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
## 2021-03-04 NOTE — ED Triage Notes (Signed)
Pt presents with rash on abdomen x 2 mos and HA x 1 day.  Was previously tx for rash and had some improvement but still present.  One spot has also developed under R arm.  No one else house has the rash.  Circular in apearance.   Pt reports h/o migraines.  Is out of Imitrex.  Advil and Tylenol overnight did not help. Constant neck pain and sharp pain in R temple.  States this is typical for her migraines.

## 2021-06-25 ENCOUNTER — Other Ambulatory Visit: Payer: Self-pay

## 2021-06-25 ENCOUNTER — Encounter: Payer: Self-pay | Admitting: Emergency Medicine

## 2021-06-25 ENCOUNTER — Ambulatory Visit
Admission: EM | Admit: 2021-06-25 | Discharge: 2021-06-25 | Disposition: A | Discharge status: Home / Self Care | Payer: Self-pay | Attending: Emergency Medicine | Admitting: Emergency Medicine

## 2021-06-25 DIAGNOSIS — G43009 Migraine without aura, not intractable, without status migrainosus: Secondary | ICD-10-CM

## 2021-06-25 MED ORDER — SUMATRIPTAN SUCCINATE 50 MG PO TABS: 50.0000 mg | ORAL_TABLET | ORAL | 1 refills | Status: DC | PRN

## 2021-06-25 MED ORDER — ONDANSETRON 8 MG PO TBDP: 8.0000 mg | ORAL_TABLET | Freq: Three times a day (TID) | ORAL | 0 refills | Status: DC | PRN

## 2021-06-25 NOTE — Discharge Instructions (Addendum)
Take the Imitrex, 50 mg at onset of headache, and you may repeat dosing in 2 hours if needed.  Do not exceed 200 mg in a 24-hour.  Use the Zofran 8 mg ODT every 8 hours as needed for nausea.  Go home and rest in the cool dark environment and try and avoid electronics as these may make her migraines worse.  You may want to consider using the following supplements co-Q10 300-400 mg daily, vitamin B2 200 mg twice daily, and magnesium threonate 400 mg daily to help prevent further migraine outbreaks.  Call to neurology to inquire about an appointment and asked to be placed on the standby list for any cancellations.
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
## 2021-06-25 NOTE — ED Triage Notes (Signed)
Patient states that she started having a migraine this morning. Has taken tylenol and naproxen without relief.

## 2021-08-05 ENCOUNTER — Ambulatory Visit
Admission: EM | Admit: 2021-08-05 | Discharge: 2021-08-05 | Disposition: A | Discharge status: Home / Self Care | Payer: Self-pay | Attending: Emergency Medicine | Admitting: Emergency Medicine

## 2021-08-05 ENCOUNTER — Other Ambulatory Visit: Payer: Self-pay

## 2021-08-05 ENCOUNTER — Ambulatory Visit: Admit: 2021-08-05 | Payer: Self-pay

## 2021-08-05 ENCOUNTER — Encounter: Payer: Self-pay | Admitting: Emergency Medicine

## 2021-08-05 DIAGNOSIS — G43009 Migraine without aura, not intractable, without status migrainosus: Secondary | ICD-10-CM

## 2021-08-05 MED ORDER — SUMATRIPTAN SUCCINATE 6 MG/0.5ML ~~LOC~~ SOLN
6.0000 mg | Freq: Once | SUBCUTANEOUS | Status: AC
  Administered 2021-08-05: 6 mg via SUBCUTANEOUS

## 2021-08-05 MED ORDER — SUMATRIPTAN SUCCINATE 50 MG PO TABS: 50.0000 mg | ORAL_TABLET | ORAL | 2 refills | Status: DC | PRN
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
## 2021-08-05 NOTE — Discharge Instructions (Addendum)
Take the Imitrex at the start of your headache and you can repeat in 2 hours as needed.  Use the Zofran as needed for nausea as needed.  Return for re-evaluation of new or worsening symptoms.

## 2021-08-05 NOTE — ED Triage Notes (Signed)
Pt states she started having a migraine yesterday afternoon. She has tried tylenol without relief.

## 2021-10-16 ENCOUNTER — Other Ambulatory Visit: Payer: Self-pay

## 2021-10-16 ENCOUNTER — Ambulatory Visit
Admission: EM | Admit: 2021-10-16 | Discharge: 2021-10-16 | Disposition: A | Discharge status: Home / Self Care | Payer: Self-pay

## 2021-10-16 DIAGNOSIS — R110 Nausea: Secondary | ICD-10-CM

## 2021-10-16 DIAGNOSIS — G43009 Migraine without aura, not intractable, without status migrainosus: Secondary | ICD-10-CM

## 2021-10-16 HISTORY — DX: Migraine, unspecified, not intractable, without status migrainosus: G43.909

## 2021-10-16 MED ORDER — SUMATRIPTAN SUCCINATE 50 MG PO TABS: 50.0000 mg | ORAL_TABLET | ORAL | 0 refills | Status: DC | PRN

## 2021-10-16 MED ORDER — ONDANSETRON 4 MG PO TBDP: 4.0000 mg | ORAL_TABLET | Freq: Three times a day (TID) | ORAL | 0 refills | Status: DC | PRN

## 2021-10-16 MED ORDER — SUMATRIPTAN SUCCINATE 6 MG/0.5ML ~~LOC~~ SOLN
6.0000 mg | Freq: Once | SUBCUTANEOUS | Status: AC
  Administered 2021-10-16: 13:00:00 6 mg via SUBCUTANEOUS

## 2021-10-16 MED ORDER — DEXAMETHASONE SODIUM PHOSPHATE 10 MG/ML IJ SOLN
10.0000 mg | Freq: Once | INTRAMUSCULAR | Status: AC
  Administered 2021-10-16: 13:00:00 10 mg via INTRAMUSCULAR
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
## 2021-10-16 NOTE — ED Triage Notes (Signed)
Patient here for "Migraine ha". Started last night. Some light/photo sensitivity. No nausea. No vomiting. History of (diagnosed) Migraines. No PCP yet. OTC (Tylenol) used, no improvement.

## 2021-10-16 NOTE — Discharge Instructions (Signed)
-  Continue trying to find a PCP.  If you cannot find anyone in [place] you should look further in [place] or [place].  It would be more beneficial for you in the long run to be established with a PCP you can refill your medications you do not always have to come to urgent care.  You can also be referred to neurology. -You have been treated in the clinic for migraine today and have also sent medications to the pharmacy. -Go to emergency department if your migraine does not go away in 72 hours or you have any signs or symptoms and characteristic of typical migraines.

## 2021-11-19 ENCOUNTER — Ambulatory Visit
Admission: EM | Admit: 2021-11-19 | Discharge: 2021-11-19 | Disposition: A | Discharge status: Home / Self Care | Payer: Self-pay | Attending: Physician Assistant | Admitting: Physician Assistant

## 2021-11-19 ENCOUNTER — Other Ambulatory Visit: Payer: Self-pay

## 2021-11-19 ENCOUNTER — Encounter: Payer: Self-pay | Admitting: Emergency Medicine

## 2021-11-19 DIAGNOSIS — G43809 Other migraine, not intractable, without status migrainosus: Secondary | ICD-10-CM

## 2021-11-19 MED ORDER — ONDANSETRON 4 MG PO TBDP: 4.0000 mg | ORAL_TABLET | Freq: Three times a day (TID) | ORAL | 1 refills | Status: DC | PRN

## 2021-11-19 MED ORDER — DEXAMETHASONE SODIUM PHOSPHATE 10 MG/ML IJ SOLN
10.0000 mg | Freq: Once | INTRAMUSCULAR | Status: AC
  Administered 2021-11-19: 10 mg via INTRAMUSCULAR

## 2021-11-19 MED ORDER — SUMATRIPTAN SUCCINATE 50 MG PO TABS: ORAL_TABLET | ORAL | 1 refills | Status: DC

## 2021-11-19 MED ORDER — SUMATRIPTAN SUCCINATE 6 MG/0.5ML ~~LOC~~ SOLN
6.0000 mg | Freq: Once | SUBCUTANEOUS | Status: AC
  Administered 2021-11-19: 6 mg via SUBCUTANEOUS

## 2021-11-19 NOTE — Discharge Instructions (Signed)
-  We have treated your migraine in the clinic today. - I have sent in Imitrex and Zofran for you.  I placed 1 refill on those.  Hopefully this lasted a couple of months until you are able to get established with a new PCP. - Avoid migraine triggers. - Go to ER for any migraines that do not go away after 3 days or if you have any more severe symptoms that you have never had before including severe headache, feeling faint or passing out, dizziness, weakness, numbness/tingling, speech or balance problems. - It is important that you see a neurologist.  You should not be going through some any Imitrex tablets.  There are medications that he can take prophylactically to try to prevent migraines and there are other abortive treatments as well.

## 2021-11-19 NOTE — ED Triage Notes (Signed)
Pt c/o migraine. Started yesterday morning. Sensitivity to sounds and light.
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
## 2021-12-16 ENCOUNTER — Ambulatory Visit
Admission: EM | Admit: 2021-12-16 | Discharge: 2021-12-16 | Disposition: A | Discharge status: Home / Self Care | Payer: Self-pay | Attending: Physician Assistant | Admitting: Physician Assistant

## 2021-12-16 ENCOUNTER — Other Ambulatory Visit: Payer: Self-pay

## 2021-12-16 ENCOUNTER — Encounter: Payer: Self-pay | Admitting: Emergency Medicine

## 2021-12-16 DIAGNOSIS — G43909 Migraine, unspecified, not intractable, without status migrainosus: Secondary | ICD-10-CM

## 2021-12-16 DIAGNOSIS — R21 Rash and other nonspecific skin eruption: Secondary | ICD-10-CM

## 2021-12-16 MED ORDER — ONDANSETRON 4 MG PO TBDP: 4.0000 mg | ORAL_TABLET | Freq: Three times a day (TID) | ORAL | 1 refills | Status: DC | PRN

## 2021-12-16 MED ORDER — SUMATRIPTAN SUCCINATE 50 MG PO TABS: ORAL_TABLET | ORAL | 1 refills | Status: DC

## 2021-12-16 MED ORDER — DEXAMETHASONE SODIUM PHOSPHATE 10 MG/ML IJ SOLN
5.0000 mg | Freq: Once | INTRAMUSCULAR | Status: AC
  Administered 2021-12-16: 5 mg via INTRAMUSCULAR

## 2021-12-16 MED ORDER — CLOTRIMAZOLE-BETAMETHASONE 1-0.05 % EX CREA: TOPICAL_CREAM | CUTANEOUS | 0 refills | Status: DC

## 2021-12-16 NOTE — ED Triage Notes (Signed)
Pt c/o rash on her back on the right side. She states it started yesterday. She states it is extremely itchy.

## 2021-12-16 NOTE — Discharge Instructions (Addendum)
-  Treated for rash today -In clinic you are given a dexamethasone injection, a steroid that should help pretty quickly with symptoms. -Also given a topical ointment with an antifungal as well as a steroid for additional coverage. -We will return to clinic should rash become painful or not improved.
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
## 2021-12-17 ENCOUNTER — Encounter: Payer: Self-pay | Admitting: Emergency Medicine

## 2022-01-03 ENCOUNTER — Other Ambulatory Visit: Payer: Self-pay

## 2022-01-03 ENCOUNTER — Ambulatory Visit
Admission: EM | Admit: 2022-01-03 | Discharge: 2022-01-03 | Disposition: A | Discharge status: Home / Self Care | Payer: Self-pay | Attending: Emergency Medicine | Admitting: Emergency Medicine

## 2022-01-03 ENCOUNTER — Encounter: Payer: Self-pay | Admitting: Emergency Medicine

## 2022-01-03 DIAGNOSIS — J029 Acute pharyngitis, unspecified: Secondary | ICD-10-CM | POA: Insufficient documentation

## 2022-01-03 DIAGNOSIS — M545 Low back pain, unspecified: Secondary | ICD-10-CM | POA: Insufficient documentation

## 2022-01-03 LAB — GROUP A STREP BY PCR: Group A Strep by PCR: NOT DETECTED

## 2022-01-03 MED ORDER — AMOXICILLIN 500 MG PO CAPS: 500.0000 mg | ORAL_CAPSULE | Freq: Two times a day (BID) | ORAL | 0 refills | Status: AC

## 2022-01-03 MED ORDER — PREDNISONE 20 MG PO TABS: 40.0000 mg | ORAL_TABLET | Freq: Every day | ORAL | 0 refills | Status: DC

## 2022-01-03 NOTE — ED Triage Notes (Signed)
Patient c/o stiffness and pain in her lower back that started 4 days ago.  Patient reports limited ROM in her back.  Patient also reports sore throat that started 3 days.  Patient denies fevers.  ?

## 2022-01-03 NOTE — Discharge Instructions (Signed)
Today you will be treated for strep throat based on exam, your strep test is still pending, you will be called if positive ? ?Take amoxicillin twice a day for the next 10 days, typically will begin to see improvement after 48 hours of medication use ? ?Please avoid kissing, sharing drinks or food to prevent spread, you may throw away your toothbrush after completion of antibiotics ? ?Begin use of prednisone every morning with food for the next 5 days, this medicine is to help reduce any irritation and inflammation to your back, your back pain also may be a manifestation of body aches ? ?Your pain is most likely caused by irritation to the muscles or ligaments.  ? ?You may use heating pad in 15 minute intervals as needed for additional comfort or you may find comfort in using ice in 10-15 minutes over affected area ? ?Begin stretching affected area daily for 10 minutes as tolerated to further loosen muscles  ? ?When lying down place pillow underneath and between knees for support ? ?Can try sleeping without pillow on firm mattress  ? ?Practice good posture: head back, shoulders back, chest forward, pelvis back and weight distributed evenly on both legs ? ?If pain persist after recommended treatment or reoccurs if may be beneficial to follow up with orthopedic specialist for evaluation, this doctor specializes in the bones and can manage your symptoms long-term with options such as but not limited to imaging, medications or physical therapy  ?  ?
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
## 2022-01-14 ENCOUNTER — Other Ambulatory Visit: Payer: Self-pay

## 2022-01-14 ENCOUNTER — Ambulatory Visit
Admission: EM | Admit: 2022-01-14 | Discharge: 2022-01-14 | Disposition: A | Discharge status: Home / Self Care | Payer: Self-pay | Attending: Physician Assistant | Admitting: Physician Assistant

## 2022-01-14 DIAGNOSIS — G43809 Other migraine, not intractable, without status migrainosus: Secondary | ICD-10-CM

## 2022-01-14 DIAGNOSIS — M542 Cervicalgia: Secondary | ICD-10-CM

## 2022-01-14 MED ORDER — PROPRANOLOL HCL 40 MG PO TABS: 40.0000 mg | ORAL_TABLET | Freq: Every day | ORAL | 0 refills | Status: DC

## 2022-01-14 MED ORDER — SUMATRIPTAN SUCCINATE 6 MG/0.5ML ~~LOC~~ SOLN
6.0000 mg | Freq: Once | SUBCUTANEOUS | Status: AC
  Administered 2022-01-14: 6 mg via SUBCUTANEOUS

## 2022-01-14 MED ORDER — DEXAMETHASONE SODIUM PHOSPHATE 10 MG/ML IJ SOLN
10.0000 mg | Freq: Once | INTRAMUSCULAR | Status: AC
  Administered 2022-01-14: 10 mg via INTRAMUSCULAR

## 2022-01-14 MED ORDER — SUMATRIPTAN SUCCINATE 50 MG PO TABS: ORAL_TABLET | ORAL | 2 refills | Status: DC

## 2022-01-14 MED ORDER — ORPHENADRINE CITRATE ER 100 MG PO TB12: 100.0000 mg | ORAL_TABLET | Freq: Two times a day (BID) | ORAL | 0 refills | Status: AC | PRN

## 2022-01-14 MED ORDER — PROPRANOLOL HCL 40 MG PO TABS: 40.0000 mg | ORAL_TABLET | Freq: Every day | ORAL | 1 refills | Status: DC

## 2022-01-14 NOTE — ED Triage Notes (Signed)
Pt c/o migraine and neck pain x5days.  ? ?Pt went to the chiropractor and the neck pain started after the visit. Pt states that her neck is tight.  ?
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
## 2022-01-14 NOTE — Discharge Instructions (Addendum)
-  You have been given medications in clinic for your migraine.  If migraine is not gone in the next 24 to 48 hours or is worsening, he may need to go to emergency department. ?- Since you have migraines so frequently, you have agreed to try a medication to try to prevent them.  I sent propranolol which is a beta-blocker.  It can slow your heart rate and blood pressures to keep an eye on it.  I am starting you at a low dose.  If you have any side effects please let us know.  I do not expect your migraines decreased right away.  As discussed, it can take a couple of weeks.  If this medication works for you, it may be something your new PCP can send in for you when you get one.  I have also refilled the Imitrex.  Use as needed for acute migraine.  May continue the NSAIDs as well. ?- In regards to the neck pain, you have a lot of neck tension and likely tension type headaches.  Continue with warm compresses and anti-inflammatory medication.  Have also sent a muscle relaxer to the pharmacy. ?

## 2022-01-20 ENCOUNTER — Telehealth: Payer: Self-pay

## 2022-01-20 NOTE — Telephone Encounter (Signed)
Patient called Lori Willis about a prescription question. She states she was seen 03/14 for migraine and prescribed Propanolol. She called to ask side effect questions with use of this medication. She states she was informed by prescribing provider that propanolol can  affect her blood pressure and HR. She reports she has been monitoring her BP and it has been ranging anywhere from 91/64 to 103/57, she states her baseline is 100/70-110/80 range. She also c/o of some dizziness with use of this medication. I consulted with provider, per recommendations instructed her to stop the medication if she feels dizzy or lightheadedness. I informed her if she has any further concerns to follow-up in urgent care or ED. She voiced understanding.  ?

## 2022-03-17 ENCOUNTER — Ambulatory Visit
Admission: EM | Admit: 2022-03-17 | Discharge: 2022-03-17 | Disposition: A | Discharge status: Home / Self Care | Payer: BC Managed Care – PPO | Attending: Emergency Medicine | Admitting: Emergency Medicine

## 2022-03-17 ENCOUNTER — Encounter: Payer: Self-pay | Admitting: Emergency Medicine

## 2022-03-17 DIAGNOSIS — L237 Allergic contact dermatitis due to plants, except food: Secondary | ICD-10-CM

## 2022-03-17 MED ORDER — METHYLPREDNISOLONE SODIUM SUCC 40 MG IJ SOLR
60.0000 mg | Freq: Once | INTRAMUSCULAR | Status: AC
  Administered 2022-03-17: 60 mg via INTRAMUSCULAR

## 2022-03-17 MED ORDER — PREDNISONE 10 MG (21) PO TBPK: ORAL_TABLET | Freq: Every day | ORAL | 0 refills | Status: DC

## 2022-03-17 NOTE — ED Triage Notes (Signed)
Pt reports a rash all over primarily on legs, stomach, chest and chin x 4 days. States unknown if have been exposed to poison ivy while gardening.  ?

## 2022-03-17 NOTE — Discharge Instructions (Signed)
Your rash is consistent with presentation of poison ivy/poison oak ? ?Clear the area in your garden to get rid of plant so that you do not become reinfected ? ?You have been given an injection of a steroid today to stop the inflammatory process as well as reduce your itching ? ?Start prednisone tablets tomorrow taking every morning with food as directed ? ?You may continue use of calamine lotion as well as your oral antihistamine ? ?Avoid overly hot showers or baths as this may irritate your rash ? ?You may follow-up at any urgent care as needed if you feel as if symptoms are not improving through use of medicine ?
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
## 2022-03-26 ENCOUNTER — Ambulatory Visit
Admission: EM | Admit: 2022-03-26 | Discharge: 2022-03-26 | Disposition: A | Discharge status: Home / Self Care | Payer: BC Managed Care – PPO

## 2022-03-26 DIAGNOSIS — R0789 Other chest pain: Secondary | ICD-10-CM | POA: Diagnosis not present

## 2022-03-26 DIAGNOSIS — F411 Generalized anxiety disorder: Secondary | ICD-10-CM

## 2022-03-26 MED ORDER — IBUPROFEN 600 MG PO TABS
600.0000 mg | ORAL_TABLET | Freq: Once | ORAL | Status: AC
  Administered 2022-03-26: 600 mg via ORAL

## 2022-03-26 MED ORDER — CLONIDINE HCL 0.1 MG PO TABS
0.1000 mg | ORAL_TABLET | Freq: Once | ORAL | Status: AC
  Administered 2022-03-26: 0.1 mg via ORAL

## 2022-03-26 NOTE — ED Triage Notes (Signed)
Patient presents to Urgent Care with complaints of facial tingling to lower half of face that started about about 30 mins ago, some trouble swallowing, has noted some changes in speech, chest pain, SOB, and dizziness that started about 1 hr ago. She states chest pain is located at the center of chest.  Has not taking anything for symptoms.   Denies headache, changes in medications or foods.
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
## 2022-03-26 NOTE — Discharge Instructions (Addendum)
Go home, eat dinner, drink some water, and try and get a good night sleep.  If your chest pressure/tightness returns, is associated with increased dizziness, shortness of breath, or sweating please call 911 and go to the ER.  Also if your facial numbness worsens please go to the ER.

## 2022-03-28 ENCOUNTER — Emergency Department
Admission: EM | Admit: 2022-03-28 | Discharge: 2022-03-28 | Disposition: A | Discharge status: Home / Self Care | Payer: BC Managed Care – PPO | Attending: Emergency Medicine | Admitting: Emergency Medicine

## 2022-03-28 ENCOUNTER — Emergency Department: Payer: BC Managed Care – PPO

## 2022-03-28 ENCOUNTER — Other Ambulatory Visit: Payer: Self-pay

## 2022-03-28 ENCOUNTER — Ambulatory Visit: Payer: Self-pay

## 2022-03-28 DIAGNOSIS — R0789 Other chest pain: Secondary | ICD-10-CM | POA: Diagnosis not present

## 2022-03-28 DIAGNOSIS — R42 Dizziness and giddiness: Secondary | ICD-10-CM | POA: Diagnosis not present

## 2022-03-28 DIAGNOSIS — R519 Headache, unspecified: Secondary | ICD-10-CM | POA: Insufficient documentation

## 2022-03-28 LAB — CBC WITH DIFFERENTIAL/PLATELET
Abs Immature Granulocytes: 0.02 10*3/uL (ref 0.00–0.07)
Basophils Absolute: 0 10*3/uL (ref 0.0–0.1)
Basophils Relative: 1 %
Eosinophils Absolute: 0.2 10*3/uL (ref 0.0–0.5)
Eosinophils Relative: 2 %
HCT: 42.2 % (ref 36.0–46.0)
Hemoglobin: 13.2 g/dL (ref 12.0–15.0)
Immature Granulocytes: 0 %
Lymphocytes Relative: 33 %
Lymphs Abs: 2.4 10*3/uL (ref 0.7–4.0)
MCH: 28.1 pg (ref 26.0–34.0)
MCHC: 31.3 g/dL (ref 30.0–36.0)
MCV: 90 fL (ref 80.0–100.0)
Monocytes Absolute: 0.7 10*3/uL (ref 0.1–1.0)
Monocytes Relative: 9 %
Neutro Abs: 3.9 10*3/uL (ref 1.7–7.7)
Neutrophils Relative %: 55 %
Platelets: 264 10*3/uL (ref 150–400)
RBC: 4.69 MIL/uL (ref 3.87–5.11)
RDW: 13.6 % (ref 11.5–15.5)
WBC: 7.2 10*3/uL (ref 4.0–10.5)
nRBC: 0 % (ref 0.0–0.2)

## 2022-03-28 LAB — COMPREHENSIVE METABOLIC PANEL
ALT: 23 U/L (ref 0–44)
AST: 22 U/L (ref 15–41)
Albumin: 4.1 g/dL (ref 3.5–5.0)
Alkaline Phosphatase: 50 U/L (ref 38–126)
Anion gap: 7 (ref 5–15)
BUN: 13 mg/dL (ref 6–20)
CO2: 28 mmol/L (ref 22–32)
Calcium: 9.6 mg/dL (ref 8.9–10.3)
Chloride: 104 mmol/L (ref 98–111)
Creatinine, Ser: 0.65 mg/dL (ref 0.44–1.00)
GFR, Estimated: >60 mL/min (ref >60)
Glucose, Bld: 91 mg/dL (ref 70–99)
Potassium: 3.6 mmol/L (ref 3.5–5.1)
Sodium: 139 mmol/L (ref 135–145)
Total Bilirubin: 0.6 mg/dL (ref 0.3–1.2)
Total Protein: 7.3 g/dL (ref 6.5–8.1)

## 2022-03-28 LAB — TROPONIN I (HIGH SENSITIVITY): Troponin I (High Sensitivity): <2 ng/L (ref <18)

## 2022-03-28 MED ORDER — KETOROLAC TROMETHAMINE 30 MG/ML IJ SOLN
30.0000 mg | Freq: Once | INTRAMUSCULAR | Status: AC
  Administered 2022-03-28: 30 mg via INTRAVENOUS
  Filled 2022-03-28: qty 1

## 2022-03-28 MED ORDER — SODIUM CHLORIDE 0.9 % IV BOLUS
1000.0000 mL | Freq: Once | INTRAVENOUS | Status: AC
  Administered 2022-03-28: 1000 mL via INTRAVENOUS

## 2022-03-28 MED ORDER — METOCLOPRAMIDE HCL 5 MG/ML IJ SOLN
10.0000 mg | Freq: Once | INTRAMUSCULAR | Status: AC
  Administered 2022-03-28: 10 mg via INTRAVENOUS
  Filled 2022-03-28: qty 2

## 2022-03-28 MED ORDER — HYDROXYZINE HCL 25 MG PO TABS: 25.0000 mg | ORAL_TABLET | Freq: Three times a day (TID) | ORAL | 1 refills | Status: DC | PRN

## 2022-03-28 MED ORDER — HYDROXYZINE HCL 50 MG PO TABS
50.0000 mg | ORAL_TABLET | Freq: Once | ORAL | Status: AC
  Administered 2022-03-28: 50 mg via ORAL
  Filled 2022-03-28: qty 1

## 2022-03-28 NOTE — ED Triage Notes (Signed)
Pt presents to ED with c/o of anxiety. Pt states seen at [facility] and states they were having a panic attack and states UC told pt to come here if things got worse.   Pt states UC did a EKG and states that looked fine. Pt states she keeps walking into things but did drive here today and states "that was fine". Pt states husband has mentioned cognitive issues as far as stating pt is not making sense when she talks. Pt states HX of PTSD. Pt is A&Ox4.

## 2022-03-28 NOTE — ED Provider Notes (Signed)
[facility] Provider Note    Event Date/Time   First MD Initiated Contact with Patient 03/28/22 1536     (approximate)  History   Chief Complaint: Dizziness  HPI  Lori Willis is a 26 y.o. female with a past medical history of anxiety, migraines, presents to the emergency department for dizziness as well as multiple symptoms.  According to the patient over the past 2 to 3 days she has been experiencing dizziness, feels like she could not use her mouth very well at times, states she has been experiencing some intermittent chest discomfort, feels like she is dropping things yesterday which is atypical, but states from both hands.  Denies any unilateral weakness.  Patient was seen in urgent care on Wednesday and diagnosed with likely panic attack.  Patient states her symptoms have not improved so she came to the emergency department for evaluation.  Physical Exam   Triage Vital Signs: ED Triage Vitals  Enc Vitals Group     BP --      Pulse --      Resp --      Temp --      Temp src --      SpO2 --      Weight 03/28/22 1420 140 lb (63.5 kg)     Height 03/28/22 1420 5\' 3"  (1.6 m)     Head Circumference --      Peak Flow --      Pain Score 03/28/22 1419 4     Pain Loc --      Pain Edu? --      Excl. in [place]? --     Most recent vital signs: There were no vitals filed for this visit.  General: Awake, no distress.  CV:  Good peripheral perfusion.  Regular rate and rhythm  Resp:  Normal effort.  Equal breath sounds bilaterally.  Abd:  No distention.  Soft, nontender.  No rebound or guarding.  Other:  No cranial nerve deficits, equal grip strength bilaterally.  No pronator drift.  5/5 motor in all extremities.  No sensory deficits.   ED Results / Procedures / Treatments   EKG  EKG viewed and interpreted by myself shows a normal sinus rhythm at 79 bpm with a narrow QRS, normal axis, normal intervals, no concerning ST changes.  RADIOLOGY  I have  personally reviewed the CT images no significant bleed on my interpretation. Radiology is read the CT is negative   MEDICATIONS ORDERED IN ED: Medications  ketorolac (TORADOL) 30 MG/ML injection 30 mg (has no administration in time range)  metoCLOPramide (REGLAN) injection 10 mg (has no administration in time range)  sodium chloride 0.9 % bolus 1,000 mL (has no administration in time range)  hydrOXYzine (ATARAX) tablet 50 mg (has no administration in time range)     IMPRESSION / MDM / ASSESSMENT AND PLAN / ED COURSE  I reviewed the triage vital signs and the nursing notes.  Patient presents to the emergency department for multiple symptoms including dizziness over the past several days, feels like her mouth was not working properly 2 days ago, states she feels like she is dropping things, states chest tightness at times yesterday.  Overall the patient appears well, no distress.  Intact and normal neurological exam today.  Normal physical exam.  Lab work including CBC and chemistry are normal.  We will obtain an EKG I have added on a troponin.  We will also obtain CT imaging of the  head given the patient's complaints.  Patient states moderate headache has a history of migraines, we will also treat the patient's headache to see if she feels better for this possibly indicating complicated migraine versus anxiety, less likely CVA given great neurological exam, less likely neurologic disorder such as MS.  CT and remainder the labs are pending.  EKG pending.  Patient's work-up is reassuring.  CBC is normal, chemistry is normal.  Troponin negative.  CT scan of the head is normal.  EKG is normal.  Patient states she is feeling better after migraine cocktail and hydroxyzine.  We will discharge with hydroxyzine.  Patient is currently attempting to get a PCP has made an appointment but is not for several months.  We will discharge with PCP follow-up I discussed return precautions if symptoms  return/worsen.  FINAL CLINICAL IMPRESSION(S) / ED DIAGNOSES   Dizziness Headache  Rx / DC Orders   Hydroxyzine  Note:  This document was prepared using Dragon voice recognition software and may include unintentional dictation errors.   Minna Antis, MD 03/28/22 [phone]

## 2022-03-28 NOTE — ED Notes (Signed)
DC ppw provided to patient. RX info and followup information reviewed. PT questions answered. pt declines vs at time of DC. PT provides verbal consent for dc. Pt ambulatory to lobby alert and oriented x4. 

## 2022-04-28 ENCOUNTER — Ambulatory Visit: Payer: Self-pay

## 2022-06-19 ENCOUNTER — Encounter: Payer: Self-pay | Admitting: Emergency Medicine

## 2022-06-19 ENCOUNTER — Ambulatory Visit
Admission: EM | Admit: 2022-06-19 | Discharge: 2022-06-19 | Disposition: A | Discharge status: Home / Self Care | Payer: BC Managed Care – PPO | Attending: Family Medicine | Admitting: Family Medicine

## 2022-06-19 DIAGNOSIS — L259 Unspecified contact dermatitis, unspecified cause: Secondary | ICD-10-CM

## 2022-06-19 MED ORDER — TRIAMCINOLONE ACETONIDE 0.1 % EX OINT: 1.0000 | TOPICAL_OINTMENT | Freq: Four times a day (QID) | CUTANEOUS | 0 refills | Status: DC | PRN

## 2022-06-19 NOTE — Discharge Instructions (Signed)
Stop by the pharmacy to pick up your prescriptions.  If the rash continues to spread or worsens please return to the urgent care or see your primary care doctor.
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
## 2022-08-24 ENCOUNTER — Encounter: Payer: Self-pay | Admitting: Emergency Medicine

## 2022-08-24 ENCOUNTER — Ambulatory Visit
Admission: EM | Admit: 2022-08-24 | Discharge: 2022-08-24 | Disposition: A | Discharge status: Home / Self Care | Payer: BC Managed Care – PPO | Attending: Emergency Medicine | Admitting: Emergency Medicine

## 2022-08-24 DIAGNOSIS — G43009 Migraine without aura, not intractable, without status migrainosus: Secondary | ICD-10-CM | POA: Diagnosis not present

## 2022-08-24 DIAGNOSIS — M67431 Ganglion, right wrist: Secondary | ICD-10-CM | POA: Diagnosis not present

## 2022-08-24 DIAGNOSIS — G5601 Carpal tunnel syndrome, right upper limb: Secondary | ICD-10-CM

## 2022-08-24 MED ORDER — SUMATRIPTAN SUCCINATE 50 MG PO TABS: ORAL_TABLET | ORAL | 2 refills | Status: DC

## 2022-08-24 MED ORDER — PREDNISONE 10 MG (21) PO TBPK: ORAL_TABLET | ORAL | 0 refills | Status: DC
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
## 2022-08-24 NOTE — Discharge Instructions (Signed)
Take another half dose of Imitrex when you get home to alleviate your headache.  You can also take Ibuprofen 600 mg with the imitrex.  If your headache does not resolve then you can start the prednisone dose pack tomorrow morning. You will take a dose each morning at breakfast. Do not take Ibuprofen, Aleve, or Aspirin while taking the prednisone.  For your carpal tunnel syndrome:  Wear your wrist brace at night to keep your wrist in a neutral position and help decrease inflammation in your wrist.  Limit repetitive motion activities to small increments to decrease inflammation.  You can use OTC NSAID's such as Ibuprofen or Aleve to help with inflammation as well.  If your symptoms continue, or worsen, I recommend following up with Orthopedics.

## 2022-08-24 NOTE — ED Triage Notes (Signed)
Patient states that she woke up this morning with a headache.  Patient states that she has history of migraines and ran out of her medication.  Patient also reports problems with her right wrist for three years.  Patient would like it to be looked at.  Patient states that the pain has gotten worse.

## 2022-10-18 ENCOUNTER — Ambulatory Visit
Admission: EM | Admit: 2022-10-18 | Discharge: 2022-10-18 | Disposition: A | Discharge status: Home / Self Care | Payer: BC Managed Care – PPO | Attending: Nurse Practitioner | Admitting: Nurse Practitioner

## 2022-10-18 ENCOUNTER — Encounter: Payer: Self-pay | Admitting: Emergency Medicine

## 2022-10-18 ENCOUNTER — Telehealth: Payer: Self-pay

## 2022-10-18 DIAGNOSIS — G43839 Menstrual migraine, intractable, without status migrainosus: Secondary | ICD-10-CM | POA: Diagnosis not present

## 2022-10-18 MED ORDER — KETOROLAC TROMETHAMINE 60 MG/2ML IM SOLN
60.0000 mg | Freq: Once | INTRAMUSCULAR | Status: AC
  Administered 2022-10-18: 60 mg via INTRAMUSCULAR

## 2022-10-18 MED ORDER — ONDANSETRON 8 MG PO TBDP
8.0000 mg | ORAL_TABLET | Freq: Once | ORAL | Status: AC
  Administered 2022-10-18: 8 mg via ORAL
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
## 2022-10-18 NOTE — Discharge Instructions (Signed)
You were given a Toradol injection in clinic today. Do not take any over the counter NSAID's such as Advil, ibuprofen, Aleve, or naproxen for 24 hours.  You may take tylenol if needed You may take your Imitrex as needed Please follow-up with your PCP for further evaluation and treatment options of your migraines going forward Please go to the ER for any worsening symptoms I hope you feel better soon!

## 2022-10-18 NOTE — Telephone Encounter (Signed)
Returned patient call. States she had questions about when she can take Imitrex. I reviewed DC instructions with her. Voiced understanding.

## 2022-10-18 NOTE — ED Triage Notes (Signed)
Pt c/o migraine onset yesterday. She states she took her sumatriptan but pain is worse this morning. Pt also feeling nauseous. This is a recurrent problem at least once a month.

## 2022-11-13 ENCOUNTER — Ambulatory Visit
Admission: EM | Admit: 2022-11-13 | Discharge: 2022-11-13 | Disposition: A | Discharge status: Home / Self Care | Payer: BC Managed Care – PPO | Attending: Emergency Medicine | Admitting: Emergency Medicine

## 2022-11-13 ENCOUNTER — Ambulatory Visit (INDEPENDENT_AMBULATORY_CARE_PROVIDER_SITE_OTHER): Payer: BC Managed Care – PPO

## 2022-11-13 ENCOUNTER — Encounter: Payer: Self-pay | Admitting: Emergency Medicine

## 2022-11-13 DIAGNOSIS — R0602 Shortness of breath: Secondary | ICD-10-CM | POA: Diagnosis not present

## 2022-11-13 DIAGNOSIS — J039 Acute tonsillitis, unspecified: Secondary | ICD-10-CM

## 2022-11-13 MED ORDER — ALBUTEROL SULFATE HFA 108 (90 BASE) MCG/ACT IN AERS
2.0000 | INHALATION_SPRAY | Freq: Four times a day (QID) | RESPIRATORY_TRACT | 0 refills | Status: AC | PRN
Start: 2022-11-13 — End: ?

## 2022-11-13 MED ORDER — ALBUTEROL SULFATE (2.5 MG/3ML) 0.083% IN NEBU: 2.5000 mg | INHALATION_SOLUTION | Freq: Four times a day (QID) | RESPIRATORY_TRACT | 12 refills | Status: DC | PRN

## 2022-11-13 MED ORDER — PREDNISONE 20 MG PO TABS: 40.0000 mg | ORAL_TABLET | Freq: Every day | ORAL | 0 refills | Status: DC

## 2022-11-13 NOTE — ED Triage Notes (Signed)
Pt states for the past 4 days she has been unable to take a full deep breath in. Due to this she is also having dizziness. She gets winded easily even when talking or trying to clean the house. She also reports not being able to eat due to it feeling like something is stuck in her throat and she has lost 4 pounds due to this.
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
## 2022-11-13 NOTE — Discharge Instructions (Signed)
For your tonsils -While enlarged there are no current signs of infection such as redness or Lori Willis patches therefore we will hold off on antibiotic - Begin prednisone every morning with food to help reduce inflammation to ideally help with discomfort, you may take Tylenol in addition to this -May attempt salt water gargles, throat lozenges, soft foods and warm liquids -Please increase your fluid intake and able to tolerate food so that you do not become dehydrated -Please schedule an appointment with ear nose and throat whose information is on your front page for evaluation for removal  For your shortness of breath -On exam your lungs are clear and you are getting enough air without assistance -Chest x-ray is negative -You may take 2 puffs of inhaler every 4-6 hours as needed when experiencing shortness of breath to help relax the airway -If your symptoms continue to persist or worsen please follow-up for reevaluation

## 2022-12-06 ENCOUNTER — Encounter: Payer: Self-pay | Admitting: Emergency Medicine

## 2022-12-06 ENCOUNTER — Ambulatory Visit
Admission: EM | Admit: 2022-12-06 | Discharge: 2022-12-06 | Disposition: A | Discharge status: Home / Self Care | Payer: BC Managed Care – PPO | Attending: Family Medicine | Admitting: Family Medicine

## 2022-12-06 DIAGNOSIS — G43909 Migraine, unspecified, not intractable, without status migrainosus: Secondary | ICD-10-CM

## 2022-12-06 MED ORDER — ONDANSETRON 4 MG PO TBDP: 4.0000 mg | ORAL_TABLET | Freq: Three times a day (TID) | ORAL | 0 refills | Status: DC | PRN

## 2022-12-06 MED ORDER — KETOROLAC TROMETHAMINE 60 MG/2ML IM SOLN: 60.0000 mg | Freq: Once | INTRAMUSCULAR | Status: DC

## 2022-12-06 MED ORDER — ONDANSETRON 4 MG PO TBDP
4.0000 mg | ORAL_TABLET | Freq: Once | ORAL | Status: AC
  Administered 2022-12-06: 4 mg via ORAL

## 2022-12-06 MED ORDER — KETOROLAC TROMETHAMINE 30 MG/ML IJ SOLN
30.0000 mg | Freq: Once | INTRAMUSCULAR | Status: AC
  Administered 2022-12-06: 30 mg via INTRAMUSCULAR

## 2022-12-06 MED ORDER — SUMATRIPTAN SUCCINATE 50 MG PO TABS: 50.0000 mg | ORAL_TABLET | Freq: Once | ORAL | 6 refills | Status: DC
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
## 2022-12-06 NOTE — ED Triage Notes (Signed)
Patient c/o migraine headache that started this morning.  Patient states that she had a GI bug yesterday.

## 2022-12-06 NOTE — Discharge Instructions (Signed)
Rest. Lots of fluids.  Medications as prescribed.  Take care  Dr. Lacinda Axon

## 2022-12-24 ENCOUNTER — Encounter: Payer: Self-pay | Admitting: Emergency Medicine

## 2022-12-24 ENCOUNTER — Ambulatory Visit
Admission: EM | Admit: 2022-12-24 | Discharge: 2022-12-24 | Disposition: A | Discharge status: Home / Self Care | Payer: BC Managed Care – PPO | Attending: Physician Assistant | Admitting: Physician Assistant

## 2022-12-24 DIAGNOSIS — R519 Headache, unspecified: Secondary | ICD-10-CM | POA: Diagnosis not present

## 2022-12-24 DIAGNOSIS — J029 Acute pharyngitis, unspecified: Secondary | ICD-10-CM | POA: Diagnosis not present

## 2022-12-24 DIAGNOSIS — K219 Gastro-esophageal reflux disease without esophagitis: Secondary | ICD-10-CM | POA: Insufficient documentation

## 2022-12-24 DIAGNOSIS — R058 Other specified cough: Secondary | ICD-10-CM | POA: Insufficient documentation

## 2022-12-24 DIAGNOSIS — Z1152 Encounter for screening for COVID-19: Secondary | ICD-10-CM | POA: Diagnosis not present

## 2022-12-24 DIAGNOSIS — J069 Acute upper respiratory infection, unspecified: Secondary | ICD-10-CM | POA: Diagnosis not present

## 2022-12-24 LAB — GROUP A STREP BY PCR: Group A Strep by PCR: NOT DETECTED

## 2022-12-24 LAB — RESP PANEL BY RT-PCR (RSV, FLU A&B, COVID)  RVPGX2
Influenza A by PCR: NEGATIVE
Influenza B by PCR: NEGATIVE
Resp Syncytial Virus by PCR: NEGATIVE
SARS Coronavirus 2 by RT PCR: NEGATIVE

## 2022-12-24 NOTE — ED Triage Notes (Signed)
Pt c/o sore throat, body aches, nasal congestion, sinus pressure. Started yesterday. Denies fever.

## 2022-12-24 NOTE — Discharge Instructions (Addendum)
-  Negative flu, RSV and strep testing. You have another viral illness.  URI/COLD SYMPTOMS: Your exam today is consistent with a viral illness. Antibiotics are not indicated at this time. Use medications as directed, including cough syrup, nasal saline, and decongestants. Your symptoms should improve over the next few days and resolve within 7-10 days. Increase rest and fluids. F/u if symptoms worsen or predominate such as sore throat, ear pain, productive cough, shortness of breath, or if you develop high fevers or worsening fatigue over the next several days.
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
## 2023-02-16 ENCOUNTER — Ambulatory Visit
Admission: EM | Admit: 2023-02-16 | Discharge: 2023-02-16 | Disposition: A | Discharge status: Home / Self Care | Payer: BC Managed Care – PPO | Attending: Family Medicine | Admitting: Family Medicine

## 2023-02-16 DIAGNOSIS — G43909 Migraine, unspecified, not intractable, without status migrainosus: Secondary | ICD-10-CM

## 2023-02-16 DIAGNOSIS — M545 Low back pain, unspecified: Secondary | ICD-10-CM

## 2023-02-16 DIAGNOSIS — R112 Nausea with vomiting, unspecified: Secondary | ICD-10-CM

## 2023-02-16 LAB — URINALYSIS, W/ REFLEX TO CULTURE (INFECTION SUSPECTED)
Glucose, UA: NEGATIVE mg/dL
Hgb urine dipstick: NEGATIVE
Ketones, ur: 40 mg/dL — AB
Leukocytes,Ua: NEGATIVE
Nitrite: NEGATIVE
Specific Gravity, Urine: >1.03 — ABNORMAL HIGH (ref 1.005–1.030)
pH: 5.5 (ref 5.0–8.0)

## 2023-02-16 LAB — COMPREHENSIVE METABOLIC PANEL
ALT: 17 U/L (ref 0–44)
AST: 24 U/L (ref 15–41)
Albumin: 4 g/dL (ref 3.5–5.0)
Alkaline Phosphatase: 49 U/L (ref 38–126)
Anion gap: 8 (ref 5–15)
BUN: 10 mg/dL (ref 6–20)
CO2: 22 mmol/L (ref 22–32)
Calcium: 8.8 mg/dL — ABNORMAL LOW (ref 8.9–10.3)
Chloride: 104 mmol/L (ref 98–111)
Creatinine, Ser: 0.65 mg/dL (ref 0.44–1.00)
GFR, Estimated: >60 mL/min (ref >60)
Glucose, Bld: 114 mg/dL — ABNORMAL HIGH (ref 70–99)
Potassium: 3.7 mmol/L (ref 3.5–5.1)
Sodium: 134 mmol/L — ABNORMAL LOW (ref 135–145)
Total Bilirubin: 0.4 mg/dL (ref 0.3–1.2)
Total Protein: 7.4 g/dL (ref 6.5–8.1)

## 2023-02-16 LAB — LIPASE, BLOOD: Lipase: 48 U/L (ref 11–51)

## 2023-02-16 LAB — CBC WITH DIFFERENTIAL/PLATELET
Abs Immature Granulocytes: 0.01 10*3/uL (ref 0.00–0.07)
Basophils Absolute: 0 10*3/uL (ref 0.0–0.1)
Basophils Relative: 1 %
Eosinophils Absolute: 0 10*3/uL (ref 0.0–0.5)
Eosinophils Relative: 0 %
HCT: 38.2 % (ref 36.0–46.0)
Hemoglobin: 12.5 g/dL (ref 12.0–15.0)
Immature Granulocytes: 0 %
Lymphocytes Relative: 9 %
Lymphs Abs: 0.4 10*3/uL — ABNORMAL LOW (ref 0.7–4.0)
MCH: 28.7 pg (ref 26.0–34.0)
MCHC: 32.7 g/dL (ref 30.0–36.0)
MCV: 87.8 fL (ref 80.0–100.0)
Monocytes Absolute: 0.7 10*3/uL (ref 0.1–1.0)
Monocytes Relative: 16 %
Neutro Abs: 3.6 10*3/uL (ref 1.7–7.7)
Neutrophils Relative %: 74 %
Platelets: 188 10*3/uL (ref 150–400)
RBC: 4.35 MIL/uL (ref 3.87–5.11)
RDW: 13.2 % (ref 11.5–15.5)
WBC: 4.8 10*3/uL (ref 4.0–10.5)
nRBC: 0 % (ref 0.0–0.2)

## 2023-02-16 MED ORDER — METOCLOPRAMIDE HCL 5 MG/ML IJ SOLN
10.0000 mg | Freq: Once | INTRAMUSCULAR | Status: AC
  Administered 2023-02-16: 10 mg via INTRAMUSCULAR

## 2023-02-16 MED ORDER — KETOROLAC TROMETHAMINE 60 MG/2ML IM SOLN
30.0000 mg | Freq: Once | INTRAMUSCULAR | Status: AC
  Administered 2023-02-16: 30 mg via INTRAMUSCULAR

## 2023-02-16 MED ORDER — PROMETHAZINE HCL 25 MG PO TABS: 25.0000 mg | ORAL_TABLET | Freq: Four times a day (QID) | ORAL | 0 refills | Status: DC | PRN

## 2023-02-16 MED ORDER — IBUPROFEN 800 MG PO TABS: 800.0000 mg | ORAL_TABLET | Freq: Three times a day (TID) | ORAL | 0 refills | Status: DC | PRN

## 2023-02-16 MED ORDER — METOCLOPRAMIDE HCL 5 MG/ML IJ SOLN: 10.0000 mg | Freq: Once | INTRAMUSCULAR | Status: DC

## 2023-02-16 NOTE — Discharge Instructions (Addendum)
For you headache:  Follow up with your primary care provider to speak about headaches as you may need imaging of your head or to see a neurologist.    Take 1 tablet benadryl, 1 tablet of Phenergan, 2 tablets of Tylenol with 800 mg Ibuprofen for your headache. Stop by the pharmacy to pick up your prescriptions.

## 2023-02-16 NOTE — ED Triage Notes (Signed)
Pt c/o emesis & fatigue x1 day, states she feels dehydrated. 4 episodes of emesis today. Is unable to keep foods & fluids down. Denies any abd pain.
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
## 2023-02-18 ENCOUNTER — Other Ambulatory Visit: Payer: Self-pay

## 2023-02-18 ENCOUNTER — Emergency Department
Admission: EM | Admit: 2023-02-18 | Discharge: 2023-02-18 | Disposition: A | Discharge status: Home / Self Care | Payer: Self-pay | Attending: Emergency Medicine | Admitting: Emergency Medicine

## 2023-02-18 ENCOUNTER — Emergency Department: Payer: Self-pay

## 2023-02-18 DIAGNOSIS — G43909 Migraine, unspecified, not intractable, without status migrainosus: Secondary | ICD-10-CM | POA: Insufficient documentation

## 2023-02-18 MED ORDER — SODIUM CHLORIDE 0.9 % IV SOLN
12.5000 mg | Freq: Four times a day (QID) | INTRAVENOUS | Status: DC | PRN
  Administered 2023-02-18: 12.5 mg via INTRAVENOUS
  Filled 2023-02-18: qty 12.5

## 2023-02-18 MED ORDER — KETOROLAC TROMETHAMINE 30 MG/ML IJ SOLN
30.0000 mg | Freq: Once | INTRAMUSCULAR | Status: AC
  Administered 2023-02-18: 30 mg via INTRAVENOUS
  Filled 2023-02-18: qty 1

## 2023-02-18 MED ORDER — SODIUM CHLORIDE 0.9 % IV BOLUS
500.0000 mL | Freq: Once | INTRAVENOUS | Status: AC
  Administered 2023-02-18: 500 mL via INTRAVENOUS

## 2023-02-18 NOTE — ED Triage Notes (Signed)
Pt states migraine headache since Sunday. Pt complains of light sensitivity and sensitivity to sound. Pt complains of n/v. No nuchal rididity noted.

## 2023-02-18 NOTE — ED Provider Notes (Signed)
[facility] Provider Note    Event Date/Time   First MD Initiated Contact with Patient 02/18/23 [phone]     (approximate)   History   Migraine   HPI  Lori Willis is a 27 y.o. female with history of migraines who presents with complaints of headache.  Patient describes headache has been present for about 3 days.  Was seen at urgent care 2 days ago and was told to come to the emergency department if no improvement or worsening for possible CT scan.  Patient is afebrile, does not have any neck pain.  Mild nausea.  No nasal congestion reported, no sinus pressure     Physical Exam   Triage Vital Signs: ED Triage Vitals [02/18/23 0741]  Enc Vitals Group     BP 116/72     Pulse Rate 86     Resp 16     Temp 98.1 F (36.7 C)     Temp Source Oral     SpO2 100 %     Weight 65.3 kg (144 lb)     Height 1.6 m ( )     Head Circumference      Peak Flow      Pain Score 10     Pain Loc      Pain Edu?      Excl. in GC?     Most recent vital signs: Vitals:   02/18/23 0741 02/18/23 1111  BP: 116/72 116/70  Pulse: 86 84  Resp: 16 16  Temp: 98.1 F (36.7 C) 98.1 F (36.7 C)  SpO2: 100% 100%     General: Awake, no distress.  CV:  Good peripheral perfusion.  Resp:  Normal effort.  Abd:  No distention.  Other:  Normal neuroexam   ED Results / Procedures / Treatments   Labs (all labs ordered are listed, but only abnormal results are displayed) Labs Reviewed - No data to display   EKG     RADIOLOGY CT head viewed interpret by me, no acute abnormality    PROCEDURES:  Critical Care performed:   Procedures   MEDICATIONS ORDERED IN ED: Medications  promethazine (PHENERGAN) 12.5 mg in sodium chloride 0.9 % 50 mL IVPB (0 mg Intravenous Stopped 02/18/23 1326)  ketorolac (TORADOL) 30 MG/ML injection 30 mg (30 mg Intravenous Given 02/18/23 0841)  sodium chloride 0.9 % bolus 500 mL (0 mLs Intravenous Stopped 02/18/23 1110)      IMPRESSION / MDM / ASSESSMENT AND PLAN / ED COURSE  I reviewed the triage vital signs and the nursing notes. Patient's presentation is most consistent with exacerbation of chronic illness.  Patient with history of migraine headaches presents with complaints of headache x 3 days.  Initially had been global, now more located behind right eye.  Differential includes migraine headache, not consistent with COVID or influenza, afebrile, not consistent with meningitis.  No neck pain afebrile, well-appearing  Will treat with IV Toradol, IV Phenergan given allergies to Reglan and Benadryl, will obtain CT head given her concerns.  CT head is unremarkable, patient feeling better after treatment, she would like to go home to sleep in her own bed, she knows she can return anytime.      FINAL CLINICAL IMPRESSION(S) / ED DIAGNOSES   Final diagnoses:  Migraine without status migrainosus, not intractable, [facility]   ED Discharge Orders     None        Note:  This document was prepared using Dragon voice recognition software and may include unintentional dictation errors.   Jene Every, MD 02/18/23 1343

## 2023-03-19 ENCOUNTER — Ambulatory Visit
Admission: RE | Admit: 2023-03-19 | Discharge: 2023-03-19 | Disposition: A | Discharge status: Home / Self Care | Payer: Self-pay | Source: Ambulatory Visit | Attending: Emergency Medicine | Admitting: Emergency Medicine

## 2023-03-19 VITALS — BP 100/70 | HR 88 | Temp 98.6°F | Resp 16

## 2023-03-19 DIAGNOSIS — J069 Acute upper respiratory infection, unspecified: Secondary | ICD-10-CM

## 2023-03-19 MED ORDER — IPRATROPIUM BROMIDE 0.06 % NA SOLN: 2.0000 | Freq: Four times a day (QID) | NASAL | 12 refills | Status: DC

## 2023-03-19 MED ORDER — PROMETHAZINE-DM 6.25-15 MG/5ML PO SYRP: 5.0000 mL | ORAL_SOLUTION | Freq: Four times a day (QID) | ORAL | 0 refills | Status: DC | PRN
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
## 2023-03-19 NOTE — Discharge Instructions (Signed)
Use the Atrovent nasal spray, 2 squirts in each nostril every 6 hours, as needed for runny nose and postnasal drip.  Use OTC Delsym, Robitussin, or Zarbee's during the day as needed for cough.  Use the Promethazine DM cough syrup at bedtime for cough and congestion.  It will make you drowsy so do not take it during the day.  Return for reevaluation or see your primary care provider for any new or worsening symptoms.

## 2023-03-19 NOTE — ED Triage Notes (Signed)
Pt presents with cough, chest congestion, sore throat and runny nose x 3 days. Pt needs a note for work due to missing yesterday and today.

## 2023-03-27 ENCOUNTER — Encounter: Payer: Self-pay | Admitting: Emergency Medicine

## 2023-03-27 ENCOUNTER — Ambulatory Visit
Admission: EM | Admit: 2023-03-27 | Discharge: 2023-03-27 | Disposition: A | Discharge status: Home / Self Care | Payer: Self-pay | Attending: Physician Assistant | Admitting: Physician Assistant

## 2023-03-27 DIAGNOSIS — R0981 Nasal congestion: Secondary | ICD-10-CM

## 2023-03-27 DIAGNOSIS — R051 Acute cough: Secondary | ICD-10-CM

## 2023-03-27 DIAGNOSIS — J019 Acute sinusitis, unspecified: Secondary | ICD-10-CM

## 2023-03-27 MED ORDER — AMOXICILLIN-POT CLAVULANATE 875-125 MG PO TABS: 1.0000 | ORAL_TABLET | Freq: Two times a day (BID) | ORAL | 0 refills | Status: AC

## 2023-03-27 MED ORDER — PSEUDOEPH-BROMPHEN-DM 30-2-10 MG/5ML PO SYRP: 10.0000 mL | ORAL_SOLUTION | Freq: Four times a day (QID) | ORAL | 0 refills | Status: AC | PRN
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
## 2023-03-27 NOTE — Discharge Instructions (Addendum)
-  Likely viral upper respiratory infection, but since you have not had improvement in symptoms in the past 1.5 weeks we can try an antibiotic. -Start antibiotics and continue nasal spray and begin new cough medicine. -Return if fever or worsening cough or shortness of breath

## 2023-03-27 NOTE — ED Triage Notes (Signed)
Patient c/o cough and chest congestion for a week.  Patient states that she was seen here at [facility] on 03/20/23 and is not better.

## 2023-04-09 ENCOUNTER — Encounter: Payer: Self-pay | Admitting: Emergency Medicine

## 2023-04-09 ENCOUNTER — Ambulatory Visit
Admission: EM | Admit: 2023-04-09 | Discharge: 2023-04-09 | Disposition: A | Discharge status: Home / Self Care | Payer: Self-pay | Attending: Emergency Medicine | Admitting: Emergency Medicine

## 2023-04-09 DIAGNOSIS — N390 Urinary tract infection, site not specified: Secondary | ICD-10-CM | POA: Insufficient documentation

## 2023-04-09 DIAGNOSIS — B3731 Acute candidiasis of vulva and vagina: Secondary | ICD-10-CM | POA: Insufficient documentation

## 2023-04-09 DIAGNOSIS — N760 Acute vaginitis: Secondary | ICD-10-CM | POA: Insufficient documentation

## 2023-04-09 DIAGNOSIS — B9689 Other specified bacterial agents as the cause of diseases classified elsewhere: Secondary | ICD-10-CM | POA: Insufficient documentation

## 2023-04-09 LAB — URINALYSIS, W/ REFLEX TO CULTURE (INFECTION SUSPECTED)
Bilirubin Urine: NEGATIVE
Glucose, UA: NEGATIVE mg/dL
Hgb urine dipstick: NEGATIVE
Ketones, ur: NEGATIVE mg/dL
Nitrite: NEGATIVE
Protein, ur: NEGATIVE mg/dL
Specific Gravity, Urine: 1.025 (ref 1.005–1.030)
pH: 6.5 (ref 5.0–8.0)

## 2023-04-09 LAB — WET PREP, GENITAL
Sperm: NONE SEEN
Trich, Wet Prep: NONE SEEN
WBC, Wet Prep HPF POC: >=10 — AB (ref <10)

## 2023-04-09 MED ORDER — METRONIDAZOLE 500 MG PO TABS: 500.0000 mg | ORAL_TABLET | Freq: Two times a day (BID) | ORAL | 0 refills | Status: DC

## 2023-04-09 MED ORDER — PHENAZOPYRIDINE HCL 200 MG PO TABS: 200.0000 mg | ORAL_TABLET | Freq: Three times a day (TID) | ORAL | 0 refills | Status: DC

## 2023-04-09 MED ORDER — FLUCONAZOLE 150 MG PO TABS: 150.0000 mg | ORAL_TABLET | ORAL | 0 refills | Status: AC

## 2023-04-09 MED ORDER — NITROFURANTOIN MONOHYD MACRO 100 MG PO CAPS: 100.0000 mg | ORAL_CAPSULE | Freq: Two times a day (BID) | ORAL | 0 refills | Status: DC
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
## 2023-04-09 NOTE — ED Triage Notes (Signed)
Pt c/o vaginal itching and pain in the vaginal area x 3 days,

## 2023-04-09 NOTE — Discharge Instructions (Addendum)
Take the Macrobid twice daily for 5 days with food for treatment of urinary tract infection.  Use the Pyridium every 8 hours as needed for urinary discomfort.  This will turn your urine a bright red-orange.  Increase your oral fluid intake so that you increase your urine production and or flushing your urinary system.  Take an over-the-counter probiotic, such as Culturelle-Align-Activia, 1 hour after each dose of antibiotic to prevent diarrhea or yeast infections from forming.  We will culture urine and change the antibiotics if necessary.  Take the Flagyl (metronidazole) 500 mg twice daily for treatment of your bacterial vaginosis.  Avoid alcohol while on the metronidazole as taken together will cause of vomiting.  Bacterial vaginosis is often caused by a imbalance of bacteria in your vaginal vault.  This is sometimes a result of using tampons or hormonal fluctuations during her menstrual cycle.  You if your symptoms are recurrent you can try using a boric acid suppository twice weekly to help maintain the acid-base balance in your vagina vault which could prevent further infection.  You can also try vaginal probiotics to help return normal bacterial balance.   For your vaginal yeast infection take 1 Diflucan tablet now and repeat dosing every 3 days for a total of 3 doses.  Return for reevaluation, or see your primary care provider, for any new or worsening symptoms.

## 2023-04-11 LAB — URINE CULTURE

## 2023-05-03 ENCOUNTER — Ambulatory Visit
Admission: EM | Admit: 2023-05-03 | Discharge: 2023-05-03 | Disposition: A | Discharge status: Home / Self Care | Payer: Self-pay | Attending: Family Medicine | Admitting: Family Medicine

## 2023-05-03 ENCOUNTER — Encounter: Payer: Self-pay | Admitting: Emergency Medicine

## 2023-05-03 DIAGNOSIS — Z758 Other problems related to medical facilities and other health care: Secondary | ICD-10-CM

## 2023-05-03 DIAGNOSIS — Z5989 Other problems related to housing and economic circumstances: Secondary | ICD-10-CM

## 2023-05-03 DIAGNOSIS — G43909 Migraine, unspecified, not intractable, without status migrainosus: Secondary | ICD-10-CM

## 2023-05-03 MED ORDER — ONDANSETRON HCL 4 MG/2ML IJ SOLN
4.0000 mg | Freq: Once | INTRAMUSCULAR | Status: AC
  Administered 2023-05-03: 4 mg via INTRAMUSCULAR

## 2023-05-03 MED ORDER — KETOROLAC TROMETHAMINE 60 MG/2ML IM SOLN
30.0000 mg | Freq: Once | INTRAMUSCULAR | Status: AC
  Administered 2023-05-03: 30 mg via INTRAMUSCULAR

## 2023-05-03 NOTE — Discharge Instructions (Addendum)
For you headache:  Schedule a new patient appointment with a primary care provider to speak about headaches as you may need  to see a neurologist.     Take 1 tablet benadryl, 1 tablet of zofran, 2 tablets of Tylenol with 800 mg Ibuprofen for your headache. Stop by the pharmacy to pick up your prescriptions

## 2023-05-03 NOTE — ED Triage Notes (Signed)
Patient presents to Urgent Care with complaints of migraine since 2 days ago. Patient reports light  and sound sensitivity, neck pain, right eye pain. Took Imitrex for symptoms no improvements.
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
## 2023-05-21 ENCOUNTER — Ambulatory Visit
Admission: EM | Admit: 2023-05-21 | Discharge: 2023-05-21 | Disposition: A | Discharge status: Home / Self Care | Payer: 59 | Attending: Emergency Medicine | Admitting: Emergency Medicine

## 2023-05-21 ENCOUNTER — Encounter: Payer: Self-pay | Admitting: Emergency Medicine

## 2023-05-21 DIAGNOSIS — F419 Anxiety disorder, unspecified: Secondary | ICD-10-CM | POA: Insufficient documentation

## 2023-05-21 DIAGNOSIS — Z1152 Encounter for screening for COVID-19: Secondary | ICD-10-CM | POA: Diagnosis not present

## 2023-05-21 DIAGNOSIS — J069 Acute upper respiratory infection, unspecified: Secondary | ICD-10-CM | POA: Insufficient documentation

## 2023-05-21 DIAGNOSIS — M419 Scoliosis, unspecified: Secondary | ICD-10-CM | POA: Insufficient documentation

## 2023-05-21 DIAGNOSIS — G43009 Migraine without aura, not intractable, without status migrainosus: Secondary | ICD-10-CM | POA: Diagnosis present

## 2023-05-21 DIAGNOSIS — B9789 Other viral agents as the cause of diseases classified elsewhere: Secondary | ICD-10-CM | POA: Diagnosis not present

## 2023-05-21 DIAGNOSIS — K219 Gastro-esophageal reflux disease without esophagitis: Secondary | ICD-10-CM | POA: Insufficient documentation

## 2023-05-21 LAB — SARS CORONAVIRUS 2 BY RT PCR: SARS Coronavirus 2 by RT PCR: NEGATIVE

## 2023-05-21 MED ORDER — METOCLOPRAMIDE HCL 5 MG/ML IJ SOLN
10.0000 mg | Freq: Once | INTRAMUSCULAR | Status: AC
  Administered 2023-05-21: 10 mg via INTRAMUSCULAR

## 2023-05-21 MED ORDER — KETOROLAC TROMETHAMINE 30 MG/ML IJ SOLN
30.0000 mg | Freq: Once | INTRAMUSCULAR | Status: AC
  Administered 2023-05-21: 30 mg via INTRAMUSCULAR

## 2023-05-21 MED ORDER — ONDANSETRON 8 MG PO TBDP: 8.0000 mg | ORAL_TABLET | Freq: Three times a day (TID) | ORAL | 0 refills | Status: DC | PRN

## 2023-05-21 MED ORDER — KETOROLAC TROMETHAMINE 10 MG PO TABS: 10.0000 mg | ORAL_TABLET | Freq: Four times a day (QID) | ORAL | 0 refills | Status: DC | PRN

## 2023-05-21 NOTE — Discharge Instructions (Addendum)
Go home and rest in a cool dark environment.  Use the Toradol 10 mg every 6 hours with food as needed for mild to moderate pain.  Do not take it for more than 5 days in a row.  Continue to use your Imitrex as previously prescribed for breakthrough pain..  Use the Zofran every 8 hours as needed for nausea and vomiting.  If your headache does not resolve, or worsens, please go to the ER for evaluation.
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
## 2023-05-21 NOTE — ED Triage Notes (Signed)
Pt presents with a migraine, dizziness and nausea that started this morning.

## 2023-07-08 ENCOUNTER — Other Ambulatory Visit: Payer: Self-pay

## 2023-07-08 ENCOUNTER — Ambulatory Visit
Admission: EM | Admit: 2023-07-08 | Discharge: 2023-07-08 | Disposition: A | Discharge status: Home / Self Care | Payer: Managed Care, Other (non HMO) | Attending: Physician Assistant | Admitting: Physician Assistant

## 2023-07-08 DIAGNOSIS — B349 Viral infection, unspecified: Secondary | ICD-10-CM | POA: Diagnosis not present

## 2023-07-08 DIAGNOSIS — R051 Acute cough: Secondary | ICD-10-CM

## 2023-07-08 MED ORDER — IPRATROPIUM BROMIDE 0.06 % NA SOLN: 2.0000 | Freq: Four times a day (QID) | NASAL | 0 refills | Status: DC

## 2023-07-08 MED ORDER — PROMETHAZINE-DM 6.25-15 MG/5ML PO SYRP: 5.0000 mL | ORAL_SOLUTION | Freq: Four times a day (QID) | ORAL | 0 refills | Status: DC | PRN

## 2023-07-08 NOTE — ED Triage Notes (Addendum)
Productive cough and nasal and chest congestion x 1 week with posttussive emesis. Denies fever
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
## 2023-07-08 NOTE — Discharge Instructions (Addendum)
URI/COLD SYMPTOMS: Your exam today is consistent with a viral illness. Antibiotics are not indicated at this time. Use medications as directed, including cough syrup, nasal saline, and decongestants. Your symptoms should improve over the next few days and resolve within a couple of weeks. Increase rest and fluids. F/u if symptoms worsen or predominate such as sore throat, ear pain, productive cough, shortness of breath, or if you develop high fevers or worsening fatigue over the next several days.

## 2023-08-05 IMAGING — CT CT HEAD W/O CM
4 series · 16 of 47 positions shown, 18 images · non-contrast
Comparison: No pertinent prior exams available for comparison.

CLINICAL DATA: Provided history: Dizziness, nonspecific.



[Series 2: head wo · axial · 0.41mm/px · z∈[-105,+5]mm · 7 of 30 slices shown, 9 images]
[im 4/30  brain]
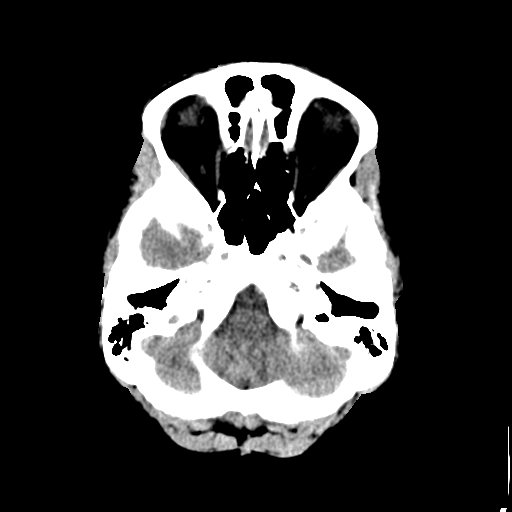
[im 4/30  bone]
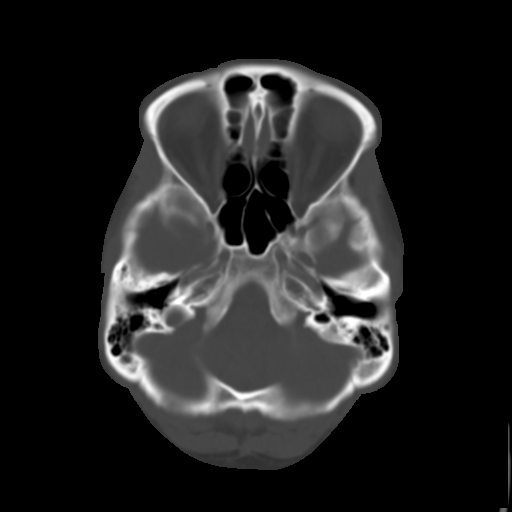
[im 8/30  brain]
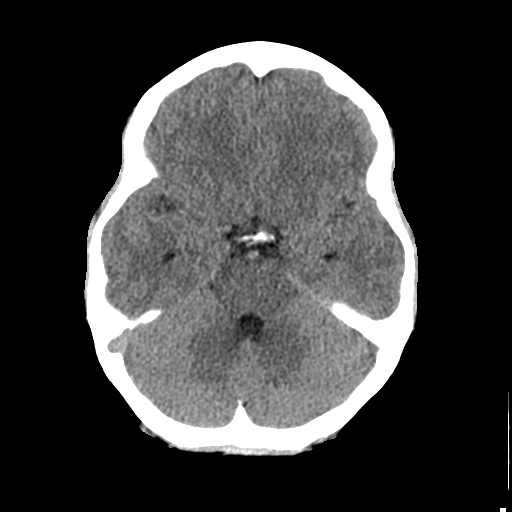
[im 11/30  brain]
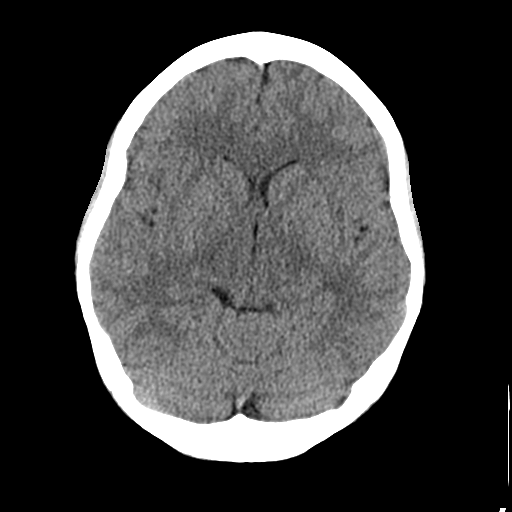
[im 15/30  brain]
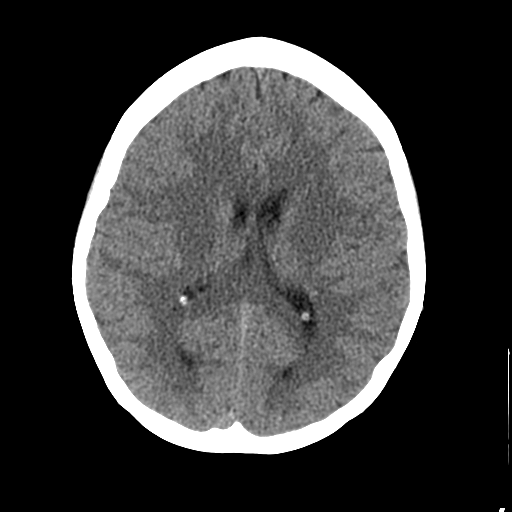
[im 19/30  brain]
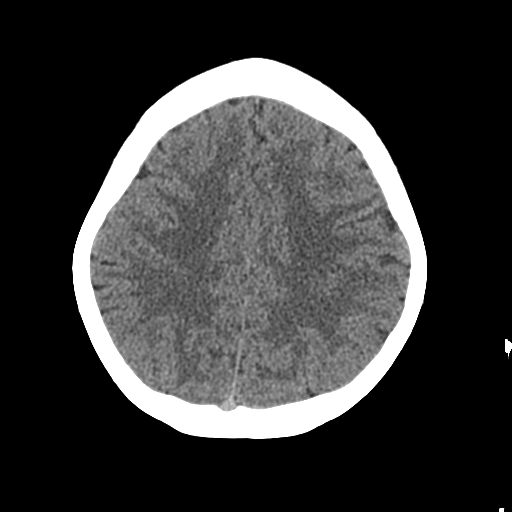
[im 19/30  bone]
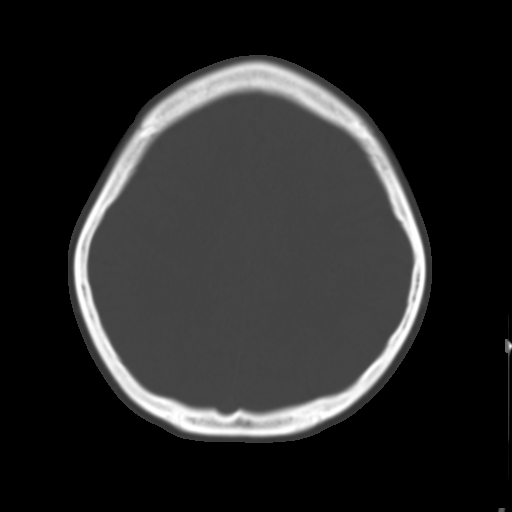
[im 22/30  brain]
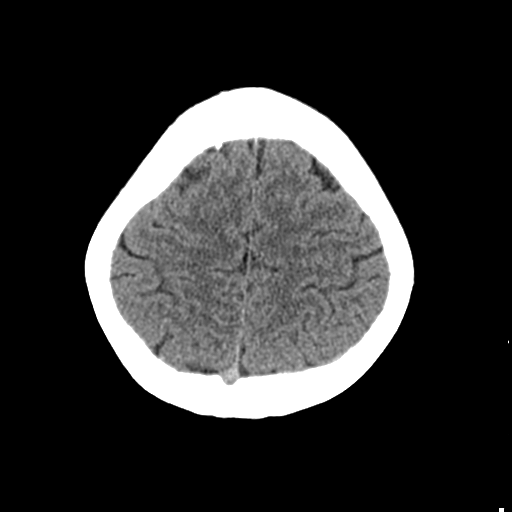
[im 26/30  brain]
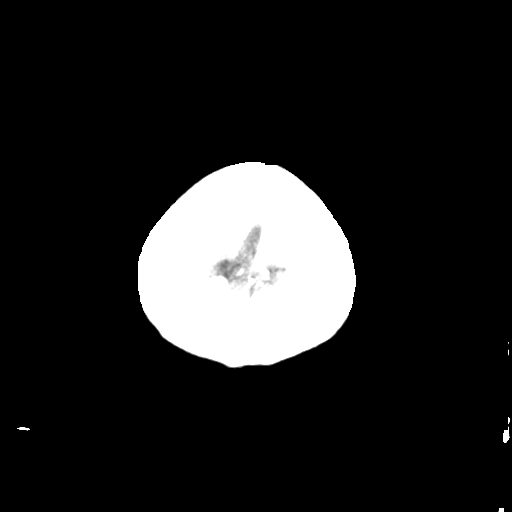

[Series 3: head bone · axial · 0.41mm/px · z∈[-106,-78]mm · 3 of 73 slices shown]
[im 8/73  bone]
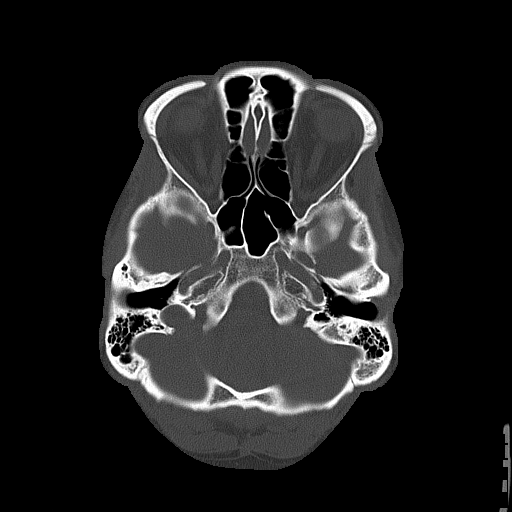
[im 15/73  bone]
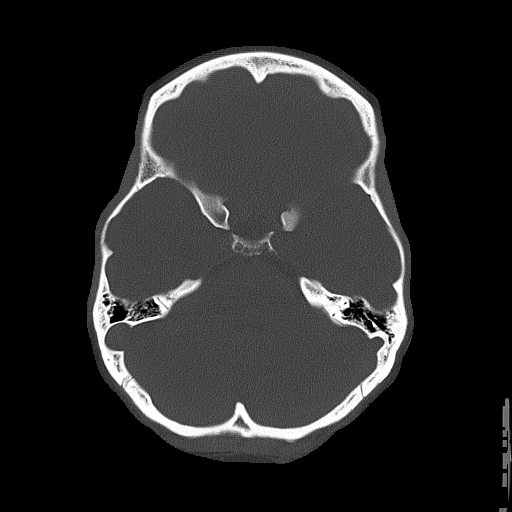
[im 22/73  bone]
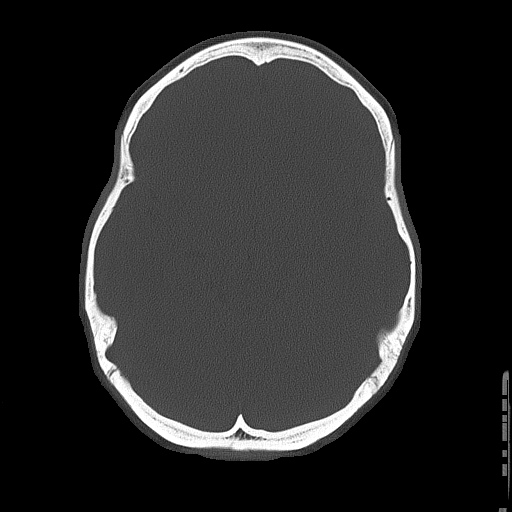

[Series 4: coronal soft tissue · coronal · 0.32mm/px · 3 of 62 slices shown]
[im 21/62  brain]
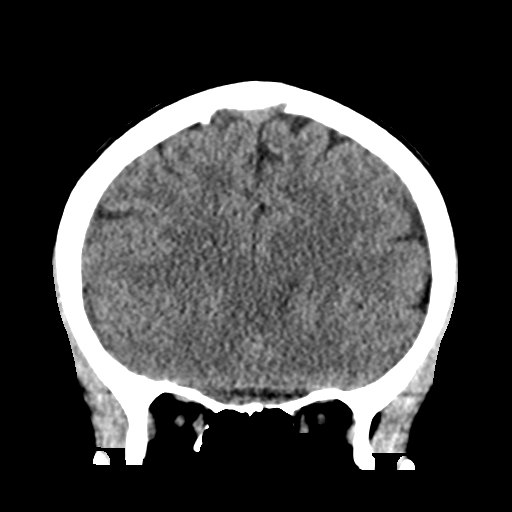
[im 28/62  brain]
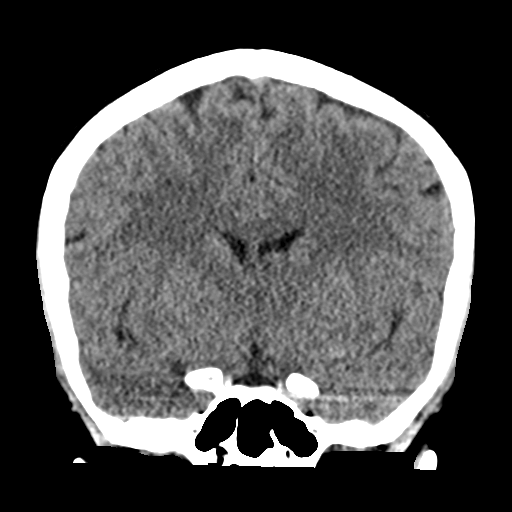
[im 34/62  brain]
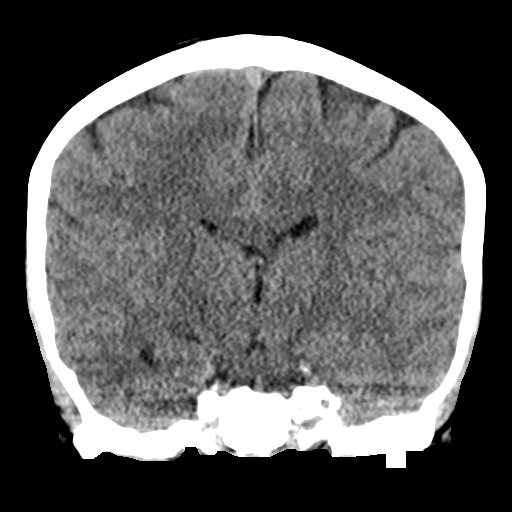

[Series 5: sagittal soft tissue · sagittal · 0.32mm/px · 3 of 53 slices shown]
[im 18/53  brain]
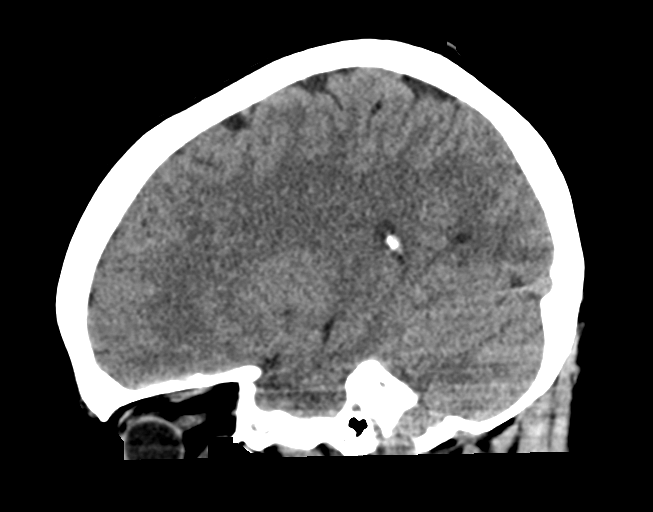
[im 27/53  brain]
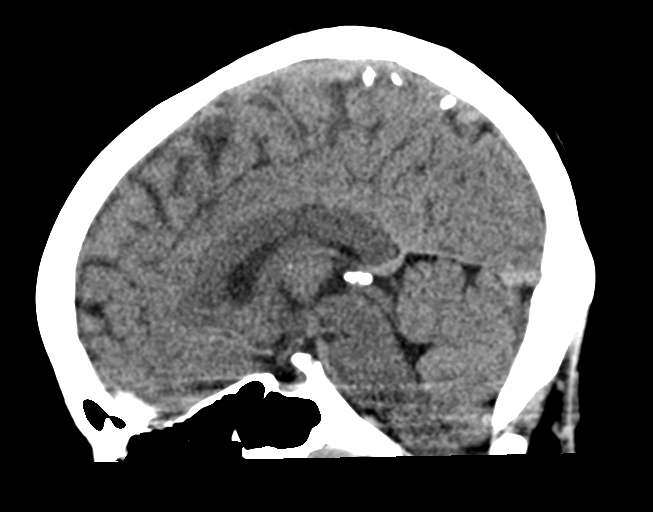
[im 35/53  brain]
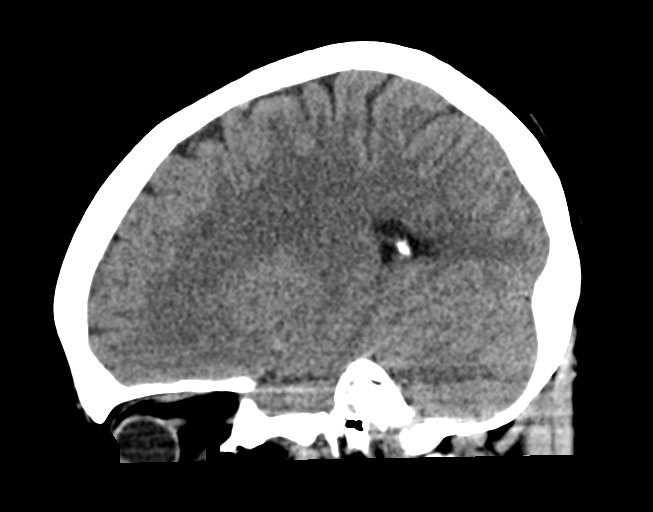

[16 of 47 positions shown; findings below may reference images not displayed]

FINDINGS: Brain:

Cerebral volume is normal.

Partially empty sella turcica.

There is no acute intracranial hemorrhage.

No demarcated cortical infarct.

No extra-axial fluid collection.

No evidence of an intracranial mass.

No midline shift.

Vascular: No hyperdense vessel.

Skull: No fracture or aggressive osseous lesion.

Sinuses/Orbits: No mass or acute finding within the imaged orbits.
No significant paranasal sinus disease at the imaged levels.
IMPRESSION: No evidence of acute intracranial abnormality.

Partially empty sella turcica. While this finding can reflect
incidental anatomic variation, it can also be associated with
idiopathic intracranial hypertension (pseudotumor cerebri).

## 2023-08-10 ENCOUNTER — Ambulatory Visit
Admission: EM | Admit: 2023-08-10 | Discharge: 2023-08-10 | Disposition: A | Discharge status: Home / Self Care | Payer: Managed Care, Other (non HMO) | Attending: Emergency Medicine | Admitting: Emergency Medicine

## 2023-08-10 ENCOUNTER — Other Ambulatory Visit: Payer: Self-pay

## 2023-08-10 DIAGNOSIS — G43909 Migraine, unspecified, not intractable, without status migrainosus: Secondary | ICD-10-CM

## 2023-08-10 MED ORDER — SUMATRIPTAN SUCCINATE 50 MG PO TABS
ORAL_TABLET | ORAL | 6 refills | Status: DC
Start: 2023-08-10 — End: 2023-11-30

## 2023-08-10 MED ORDER — SUMATRIPTAN SUCCINATE 50 MG PO TABS: ORAL_TABLET | ORAL | 6 refills | Status: DC
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
## 2023-08-10 NOTE — Discharge Instructions (Addendum)
For you headache:  Schedule a new patient appointment with a primary care provider to speak about headaches as you may need  to see a neurologist.     Take 1 tablet benadryl, 1 tablet of zofran, 2 tablets of Tylenol with 800 mg Ibuprofen for your headache. Stop by the pharmacy to pick up your prescriptions

## 2023-08-10 NOTE — ED Triage Notes (Addendum)
Headache on left side x 2 days with nausea and light sensitivity. Pt denies injury and has an hx of migraines states she ran out of imitrex and has been taking toradol for pain but does not work of long. Pt also c/o left ear pressure and popping that started over the weekend.

## 2023-08-24 ENCOUNTER — Ambulatory Visit
Admission: EM | Admit: 2023-08-24 | Discharge: 2023-08-24 | Disposition: A | Discharge status: Home / Self Care | Payer: Managed Care, Other (non HMO) | Attending: Physician Assistant | Admitting: Physician Assistant

## 2023-08-24 DIAGNOSIS — Z1152 Encounter for screening for COVID-19: Secondary | ICD-10-CM | POA: Insufficient documentation

## 2023-08-24 DIAGNOSIS — R059 Cough, unspecified: Secondary | ICD-10-CM | POA: Diagnosis present

## 2023-08-24 DIAGNOSIS — Z87891 Personal history of nicotine dependence: Secondary | ICD-10-CM | POA: Diagnosis not present

## 2023-08-24 DIAGNOSIS — B349 Viral infection, unspecified: Secondary | ICD-10-CM | POA: Insufficient documentation

## 2023-08-24 DIAGNOSIS — R051 Acute cough: Secondary | ICD-10-CM | POA: Diagnosis not present

## 2023-08-24 DIAGNOSIS — M791 Myalgia, unspecified site: Secondary | ICD-10-CM | POA: Diagnosis not present

## 2023-08-24 LAB — RESP PANEL BY RT-PCR (FLU A&B, COVID) ARPGX2
Influenza A by PCR: NEGATIVE
Influenza B by PCR: NEGATIVE
SARS Coronavirus 2 by RT PCR: NEGATIVE
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
## 2023-08-24 NOTE — Discharge Instructions (Addendum)
-  Checking you for flu and COVID.  Will call if you are positive.  If positive for 1 of these viruses will need to isolate until you are fever free for 24 hours and your symptoms are improving.  URI/COLD SYMPTOMS: Your exam today is consistent with a viral illness. Antibiotics are not indicated at this time. Use medications as directed, including cough syrup, nasal saline, and decongestants. Your symptoms should improve over the next few days and resolve within 7-10 days. Increase rest and fluids. F/u if symptoms worsen or predominate such as sore throat, ear pain, productive cough, shortness of breath, or if you develop high fevers or worsening fatigue over the next several days.

## 2023-08-24 NOTE — ED Triage Notes (Signed)
Headache, fever, bodyaches x 2 days   No OTC meds.

## 2023-11-30 ENCOUNTER — Ambulatory Visit
Admission: EM | Admit: 2023-11-30 | Discharge: 2023-11-30 | Disposition: A | Discharge status: Home / Self Care | Payer: Managed Care, Other (non HMO) | Attending: Physician Assistant | Admitting: Physician Assistant

## 2023-11-30 ENCOUNTER — Encounter: Payer: Self-pay | Admitting: Emergency Medicine

## 2023-11-30 DIAGNOSIS — R197 Diarrhea, unspecified: Secondary | ICD-10-CM

## 2023-11-30 DIAGNOSIS — A084 Viral intestinal infection, unspecified: Secondary | ICD-10-CM

## 2023-11-30 DIAGNOSIS — G43709 Chronic migraine without aura, not intractable, without status migrainosus: Secondary | ICD-10-CM

## 2023-11-30 DIAGNOSIS — R112 Nausea with vomiting, unspecified: Secondary | ICD-10-CM | POA: Diagnosis not present

## 2023-11-30 MED ORDER — ONDANSETRON 8 MG PO TBDP: 8.0000 mg | ORAL_TABLET | Freq: Three times a day (TID) | ORAL | 0 refills | Status: AC | PRN

## 2023-11-30 MED ORDER — SUMATRIPTAN SUCCINATE 50 MG PO TABS: ORAL_TABLET | ORAL | 1 refills | Status: AC
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
## 2023-11-30 NOTE — Discharge Instructions (Addendum)

## 2023-11-30 NOTE — ED Triage Notes (Signed)
Patient presents with c/o diarrhea and vomiting x 1 day. Patient has not taken anything for this. Denis any other symptoms at this time.

## 2023-12-30 ENCOUNTER — Ambulatory Visit
Admission: RE | Admit: 2023-12-30 | Discharge: 2023-12-30 | Disposition: A | Discharge status: Home / Self Care | Payer: Worker's Compensation | Source: Ambulatory Visit | Attending: Physician Assistant | Admitting: Physician Assistant

## 2023-12-30 ENCOUNTER — Other Ambulatory Visit: Payer: Self-pay | Admitting: Physician Assistant

## 2023-12-30 DIAGNOSIS — Z043 Encounter for examination and observation following other accident: Secondary | ICD-10-CM | POA: Insufficient documentation

## 2023-12-30 DIAGNOSIS — Y990 Civilian activity done for income or pay: Secondary | ICD-10-CM
